# Patient Record
Sex: Male | Born: 1980
Health system: Southern US, Community
[De-identification: ages and names within clinical notes are randomized; demographics above are authoritative.]

## PROBLEM LIST (undated history)

## (undated) DIAGNOSIS — I10 Essential (primary) hypertension: Secondary | ICD-10-CM

## (undated) HISTORY — PX: MOUTH SURGERY: SHX715

## (undated) HISTORY — PX: OTHER SURGICAL HISTORY: SHX169

## (undated) HISTORY — DX: Essential (primary) hypertension: I10

---

## 2003-06-13 ENCOUNTER — Emergency Department (HOSPITAL_COMMUNITY): Admission: EM | Admit: 2003-06-13 | Discharge: 2003-06-14 | Payer: Self-pay | Admitting: Emergency Medicine

## 2006-04-20 ENCOUNTER — Emergency Department (HOSPITAL_COMMUNITY): Admission: EM | Admit: 2006-04-20 | Discharge: 2006-04-21 | Payer: Self-pay | Admitting: Emergency Medicine

## 2012-03-10 ENCOUNTER — Emergency Department (HOSPITAL_COMMUNITY)
Admission: EM | Admit: 2012-03-10 | Discharge: 2012-03-10 | Disposition: A | Discharge status: Home / Self Care | Payer: PRIVATE HEALTH INSURANCE | Attending: Emergency Medicine | Admitting: Emergency Medicine

## 2012-03-10 ENCOUNTER — Encounter (HOSPITAL_COMMUNITY): Payer: Self-pay | Admitting: *Deleted

## 2012-03-10 DIAGNOSIS — R1013 Epigastric pain: Secondary | ICD-10-CM | POA: Insufficient documentation

## 2012-03-10 DIAGNOSIS — R112 Nausea with vomiting, unspecified: Secondary | ICD-10-CM

## 2012-03-10 DIAGNOSIS — R197 Diarrhea, unspecified: Secondary | ICD-10-CM | POA: Insufficient documentation

## 2012-03-10 LAB — POCT I-STAT, CHEM 8
HCT: 52 % (ref 39.0–52.0)
Hemoglobin: 17.7 g/dL — ABNORMAL HIGH (ref 13.0–17.0)
Potassium: 3.9 mEq/L (ref 3.5–5.1)
Sodium: 141 mEq/L (ref 135–145)
TCO2: 27 mmol/L (ref 0–100)

## 2012-03-10 MED ORDER — ONDANSETRON HCL 4 MG/2ML IJ SOLN
4.0000 mg | Freq: Once | INTRAMUSCULAR | Status: AC
  Administered 2012-03-10: 4 mg via INTRAVENOUS
  Filled 2012-03-10: qty 2

## 2012-03-10 MED ORDER — ONDANSETRON 4 MG PO TBDP: 4.0000 mg | ORAL_TABLET | Freq: Three times a day (TID) | ORAL | Status: DC | PRN

## 2012-03-10 MED ORDER — SODIUM CHLORIDE 0.9 % IV BOLUS (SEPSIS)
1000.0000 mL | Freq: Once | INTRAVENOUS | Status: AC
  Administered 2012-03-10: 1000 mL via INTRAVENOUS

## 2012-03-10 NOTE — ED Provider Notes (Signed)
History     CSN: [id number]  Arrival date & time 03/10/12  0724   First MD Initiated Contact with Patient 03/10/12 [phone]      Chief Complaint  Patient presents with  . Nausea  . Emesis  . Diarrhea  . Abdominal Pain    (Consider location/radiation/quality/duration/timing/severity/associated sxs/prior treatment) HPI Comments: Patient presents today with a chief complaint of nausea, vomiting, diarrhea.  He has also had some epigastric tenderness with episodes of vomiting.  He denies any blood in his emesis or blood in his stool.  He denies fever or chills.  He denies urinary symptoms.  He did not take anything for nausea, vomiting, or diarrhea.  He denies any prior history of abdominal surgeries.    Patient is a 32 y.o. male presenting with vomiting. The history is provided by the patient.  Emesis  This is a new problem. The current episode started yesterday. The problem occurs more than 10 times per day. The problem has not changed since onset.The emesis has an appearance of stomach contents. There has been no fever. Associated symptoms include abdominal pain and diarrhea. Pertinent negatives include no chills and no fever. Risk factors: No known ill contacts.    History reviewed. No pertinent past medical history.  Past Surgical History  Procedure Date  . None     History reviewed. No pertinent family history.  History  Substance Use Topics  . Smoking status: Never Smoker   . Smokeless tobacco: Never Used  . Alcohol Use: Yes     Comment: socially      Review of Systems  Constitutional: Negative for fever and chills.  Respiratory: Negative for shortness of breath.   Cardiovascular: Negative for chest pain.  Gastrointestinal: Positive for nausea, vomiting, abdominal pain and diarrhea. Negative for constipation, blood in stool and abdominal distention.  Genitourinary: Negative for dysuria, urgency, frequency, scrotal swelling and testicular pain.  All other systems  reviewed and are negative.    Allergies  Food  Home Medications   Current Outpatient Rx  Name  Route  Sig  Dispense  Refill  . ACETAMINOPHEN 500 MG PO TABS   Oral   Take 500 mg by mouth every 6 (six) hours as needed. For pain.         Marland Kitchen BENZONATATE 100 MG PO CAPS   Oral   Take 100 mg by mouth 3 (three) times daily as needed. For cough.         Marland Kitchen PSEUDOEPHEDRINE HCL 30 MG PO TABS   Oral   Take 30 mg by mouth every 4 (four) hours as needed. For nasal/sinus congestion.           BP 129/86  Pulse 125  Temp 98 F (36.7 C) (Oral)  Resp 18  Wt 253 lb (114.76 kg)  SpO2 99%  Physical Exam  Nursing note and vitals reviewed. Constitutional: He appears well-developed and well-nourished. No distress.  HENT:  Head: Normocephalic and atraumatic.  Mouth/Throat: Uvula is midline and oropharynx is clear and moist. Mucous membranes are dry.  Neck: Normal range of motion. Neck supple.  Cardiovascular: Normal rate, regular rhythm and normal heart sounds.   Pulmonary/Chest: Effort normal and breath sounds normal.  Abdominal: Soft. Normal appearance and bowel sounds are normal. He exhibits no distension and no mass. There is tenderness. There is no rebound and no guarding.       Mild epigastric tenderness  Musculoskeletal: Normal range of motion.  Neurological: He is alert.  Skin: Skin  is warm and dry. He is not diaphoretic.  Psychiatric: He has a normal mood and affect.    ED Course  Procedures (including critical care time)  Labs Reviewed  POCT I-STAT, CHEM 8 - Abnormal; Notable for the following:    Glucose, Bld 125 (*)     Calcium, Ion 1.11 (*)     Hemoglobin 17.7 (*)     All other components within normal limits   No results found.   No diagnosis found.  9:57 AM Patient reports that his abdominal pain and nausea has improved at this time.  He is able to tolerate PO liquids.  MDM  Patient presenting with nausea, vomiting, and diarrhea.  I stat labs WNL.   Patient given IVF and IV Zofran and symptoms improved. Patient afebrile.  Mild epigastric pain on exam, but no pain over McBurney's point.  Negative Murphy's sign.  No rebound or guarding.  Patient discharged home with Rx for Zofran.  Return precautions discussed.          Pascal Lux [place], PA-C 03/10/12 [phone]

## 2012-03-10 NOTE — ED Notes (Signed)
Pt states the "GI virus has been going around his house (wife and daughter) recently got over it". States "he feels like he was getting dehydrated due to the inability to hold any foods or liquids down and feeling extremely weak". Mucous membranes pink and dry. Denies any fevers, some abd soreness from throwing up. States he started with the n/v yesterday around 6pm and then the diarhea during the night.

## 2012-03-10 NOTE — ED Notes (Signed)
Pt from home with reports of N/V/D and generalized abdominal pain that started yesterday at 1700. Pt reports being treated at Urgent Care for flu-like symptoms on Sunday.

## 2012-03-10 NOTE — ED Provider Notes (Signed)
Medical screening examination/treatment/procedure(s) were performed by non-physician practitioner and as supervising physician I was immediately available for consultation/collaboration.   Gwyneth Sprout, MD 03/10/12 2029

## 2015-03-20 ENCOUNTER — Ambulatory Visit: Payer: PRIVATE HEALTH INSURANCE | Admitting: Family Medicine

## 2015-03-20 ENCOUNTER — Ambulatory Visit (INDEPENDENT_AMBULATORY_CARE_PROVIDER_SITE_OTHER): Payer: 59 | Admitting: Family Medicine

## 2015-03-20 ENCOUNTER — Encounter: Payer: Self-pay | Admitting: Family Medicine

## 2015-03-20 VITALS — BP 180/116 | HR 88 | Ht 73.0 in | Wt 245.0 lb

## 2015-03-20 DIAGNOSIS — M25561 Pain in right knee: Secondary | ICD-10-CM

## 2015-03-20 NOTE — Patient Instructions (Signed)
You have a lateral plica (a band of soft tissue that gets pinched under the kneecap) - this is related to patellofemoral syndrome. Avoid painful activities when possible (often deep squats, lunges, leg press bother this) Cross train with swimming, cycling with low resistance, elliptical if needed. Straight leg raise, hip side raises, straight leg raises with foot turned outwards 3 sets of 10 once a day. Add ankle weight if thesee become too easy. Consider formal physical therapy Correct foot breakdown with something like dr. Jari Sportsman active series, superfeet. Avoid flat shoes, barefoot walking as much as possible the next 6 weeks. Icing 15 minutes at a time 3-4 times a day as needed. Tylenol or aleve as needed for pain We can consider a cortisone shot into the plica directly if not improving though the data on this is mixed. Follow up with me in 6 weeks.

## 2015-03-21 DIAGNOSIS — M25561 Pain in right knee: Secondary | ICD-10-CM | POA: Insufficient documentation

## 2015-03-21 NOTE — Assessment & Plan Note (Signed)
2/2 lateral plica related to patellofemoral syndrome.  Shown home exercises to do daily.  Orthotics (dr. Jari Sportsman active series, superfeet or something similar).  Consider physical therapy, injection if not improving.  F/u in 6 weeks.

## 2015-03-21 NOTE — Progress Notes (Signed)
PCP: No primary care provider on file.  Subjective:   HPI: Patient is a 35 y.o. male here for right knee pain.  Patient reports he's had right knee pain for about a month. States he gets a rubber band sensation anterolateral right knee. Associated popping and feels like knee will give out. Pain level 5/10, can be sharp. Works two jobs - worse as he's more active. Better standing than sitting. No skin changes, fever, other complaints.  No past medical history on file.  Current Outpatient Prescriptions on File Prior to Visit  Medication Sig Dispense Refill  . acetaminophen (TYLENOL) 500 MG tablet Take 500 mg by mouth every 6 (six) hours as needed. For pain.    . benzonatate (TESSALON) 100 MG capsule Take 100 mg by mouth 3 (three) times daily as needed. For cough.    . ondansetron (ZOFRAN ODT) 4 MG disintegrating tablet Take 1 tablet (4 mg total) by mouth every 8 (eight) hours as needed for nausea. 20 tablet 0  . pseudoephedrine (SUDAFED) 30 MG tablet Take 30 mg by mouth every 4 (four) hours as needed. For nasal/sinus congestion.     No current facility-administered medications on file prior to visit.    Past Surgical History  Procedure Laterality Date  . None      Allergies  Allergen Reactions  . Food Swelling    WALNUTS    Social History   Social History  . Marital Status: Single    Spouse Name: N/A  . Number of Children: N/A  . Years of Education: N/A   Occupational History  . Not on file.   Social History Main Topics  . Smoking status: Never Smoker   . Smokeless tobacco: Never Used  . Alcohol Use: 0.0 oz/week    0 Standard drinks or equivalent per week     Comment: socially  . Drug Use: No  . Sexual Activity: Not on file   Other Topics Concern  . Not on file   Social History Narrative    No family history on file.  BP 180/116 mmHg  Pulse 88  Ht  (1.854 m)  Wt 245 lb (111.131 kg)  BMI 32.33 kg/m2  Review of Systems: See HPI above.     Objective:  Physical Exam:  Gen: NAD  Right knee: No gross deformity, ecchymoses, effusion.  Palpable plica laterally felt as goes from flexion to extension. Mild TTP here.  No joint line, post patellar or other tenderness. FROM. Negative ant/post drawers. Negative valgus/varus testing. Negative lachmanns. Negative mcmurrays, apleys, patellar apprehension. NV intact distally. Overpronation. Hip abduction 5-/5.  Minimal VMO atrophy.  Left knee: FROM without pain.    Assessment & Plan:  1. Right knee pain - 2/2 lateral plica related to patellofemoral syndrome.  Shown home exercises to do daily.  Orthotics (dr. Jari Sportsman active series, superfeet or something similar).  Consider physical therapy, injection if not improving.  F/u in 6 weeks.

## 2015-09-19 MED FILL — FLUTICASONE PROP 50 MCG SPR: 50 | 30 days supply | Qty: 16 | Fill #0

## 2015-09-19 MED FILL — AMOXICILLIN 875 MG TABLET: 875 | 10 days supply | Qty: 20 | Fill #0

## 2015-10-31 MED FILL — predniSONE 20 MG TABS: 20 | 5 days supply | Qty: 10 | Fill #0

## 2016-07-21 DIAGNOSIS — I10 Essential (primary) hypertension: Secondary | ICD-10-CM | POA: Diagnosis not present

## 2016-07-21 DIAGNOSIS — J0100 Acute maxillary sinusitis, unspecified: Secondary | ICD-10-CM | POA: Diagnosis not present

## 2016-09-01 DIAGNOSIS — I10 Essential (primary) hypertension: Secondary | ICD-10-CM | POA: Diagnosis not present

## 2016-09-19 ENCOUNTER — Ambulatory Visit (INDEPENDENT_AMBULATORY_CARE_PROVIDER_SITE_OTHER): Payer: 59 | Admitting: Family Medicine

## 2016-09-19 ENCOUNTER — Encounter: Payer: Self-pay | Admitting: Family Medicine

## 2016-09-19 VITALS — BP 130/100 | HR 83 | Ht 72.0 in | Wt 239.8 lb

## 2016-09-19 DIAGNOSIS — Z114 Encounter for screening for human immunodeficiency virus [HIV]: Secondary | ICD-10-CM | POA: Diagnosis not present

## 2016-09-19 DIAGNOSIS — Z0001 Encounter for general adult medical examination with abnormal findings: Secondary | ICD-10-CM

## 2016-09-19 DIAGNOSIS — Z0000 Encounter for general adult medical examination without abnormal findings: Secondary | ICD-10-CM

## 2016-09-19 DIAGNOSIS — I10 Essential (primary) hypertension: Secondary | ICD-10-CM

## 2016-09-19 LAB — CBC WITH DIFFERENTIAL/PLATELET
BASOS ABS: 0 10*3/uL (ref 0.0–0.1)
Basophils Relative: 0.6 % (ref 0.0–3.0)
EOS ABS: 0.2 10*3/uL (ref 0.0–0.7)
Eosinophils Relative: 2 % (ref 0.0–5.0)
HEMATOCRIT: 45 % (ref 39.0–52.0)
Hemoglobin: 14.9 g/dL (ref 13.0–17.0)
LYMPHS PCT: 38.2 % (ref 12.0–46.0)
Lymphs Abs: 3 10*3/uL (ref 0.7–4.0)
MCHC: 33.2 g/dL (ref 30.0–36.0)
MCV: 87.8 fl (ref 78.0–100.0)
MONO ABS: 0.6 10*3/uL (ref 0.1–1.0)
Monocytes Relative: 7.4 % (ref 3.0–12.0)
NEUTROS ABS: 4 10*3/uL (ref 1.4–7.7)
Neutrophils Relative %: 51.8 % (ref 43.0–77.0)
PLATELETS: 270 10*3/uL (ref 150.0–400.0)
RBC: 5.13 Mil/uL (ref 4.22–5.81)
RDW: 13.8 % (ref 11.5–15.5)
WBC: 7.8 10*3/uL (ref 4.0–10.5)

## 2016-09-19 LAB — COMPREHENSIVE METABOLIC PANEL
ALBUMIN: 4.2 g/dL (ref 3.5–5.2)
ALT: 15 U/L (ref 0–53)
AST: 20 U/L (ref 0–37)
Alkaline Phosphatase: 59 U/L (ref 39–117)
BILIRUBIN TOTAL: 0.4 mg/dL (ref 0.2–1.2)
BUN: 17 mg/dL (ref 6–23)
CALCIUM: 9.7 mg/dL (ref 8.4–10.5)
CO2: 31 meq/L (ref 19–32)
CREATININE: 0.99 mg/dL (ref 0.40–1.50)
Chloride: 102 mEq/L (ref 96–112)
GFR: 111.35 mL/min (ref >60.00)
Glucose, Bld: 95 mg/dL (ref 70–99)
Potassium: 4.1 mEq/L (ref 3.5–5.1)
Sodium: 137 mEq/L (ref 135–145)
Total Protein: 6.9 g/dL (ref 6.0–8.3)

## 2016-09-19 LAB — LIPID PANEL
CHOLESTEROL: 138 mg/dL (ref 0–200)
HDL: 48.7 mg/dL (ref >39.00)
LDL CALC: 73 mg/dL (ref 0–99)
NonHDL: 88.83
TRIGLYCERIDES: 79 mg/dL (ref 0.0–149.0)
Total CHOL/HDL Ratio: 3
VLDL: 15.8 mg/dL (ref 0.0–40.0)

## 2016-09-19 LAB — HEMOGLOBIN A1C: HEMOGLOBIN A1C: 5.6 % (ref 4.6–6.5)

## 2016-09-19 NOTE — Patient Instructions (Addendum)
We will check blood work today.  You goal blood pressure is 140/90. If your home readings are consistently above this, please let us know.  Otherwise, we will plan to see you again in 6 months to a year.  Take care,  Dr Jerline Pain    Preventive Care 18-39 Years, Male Preventive care refers to lifestyle choices and visits with your health care provider that can promote health and wellness. What does preventive care include?  A yearly physical exam. This is also called an annual well check.  Dental exams once or twice a year.  Routine eye exams. Ask your health care provider how often you should have your eyes checked.  Personal lifestyle choices, including: ? Daily care of your teeth and gums. ? Regular physical activity. ? Eating a healthy diet. ? Avoiding tobacco and drug use. ? Limiting alcohol use. ? Practicing safe sex. What happens during an annual well check? The services and screenings done by your health care provider during your annual well check will depend on your age, overall health, lifestyle risk factors, and family history of disease. Counseling Your health care provider may ask you questions about your:  Alcohol use.  Tobacco use.  Drug use.  Emotional well-being.  Home and relationship well-being.  Sexual activity.  Eating habits.  Work and work Statistician.  Screening You may have the following tests or measurements:  Height, weight, and BMI.  Blood pressure.  Lipid and cholesterol levels. These may be checked every 5 years starting at age 70.  Diabetes screening. This is done by checking your blood sugar (glucose) after you have not eaten for a while (fasting).  Skin check.  Hepatitis C blood test.  Hepatitis B blood test.  Sexually transmitted disease (STD) testing.  Discuss your test results, treatment options, and if necessary, the need for more tests with your health care provider. Vaccines Your health care provider may  recommend certain vaccines, such as:  Influenza vaccine. This is recommended every year.  Tetanus, diphtheria, and acellular pertussis (Tdap, Td) vaccine. You may need a Td booster every 10 years.  Varicella vaccine. You may need this if you have not been vaccinated.  HPV vaccine. If you are 82 or younger, you may need three doses over 6 months.  Measles, mumps, and rubella (MMR) vaccine. You may need at least one dose of MMR.You may also need a second dose.  Pneumococcal 13-valent conjugate (PCV13) vaccine. You may need this if you have certain conditions and have not been vaccinated.  Pneumococcal polysaccharide (PPSV23) vaccine. You may need one or two doses if you smoke cigarettes or if you have certain conditions.  Meningococcal vaccine. One dose is recommended if you are age 20-21 years and a first-year college student living in a residence hall, or if you have one of several medical conditions. You may also need additional booster doses.  Hepatitis A vaccine. You may need this if you have certain conditions or if you travel or work in places where you may be exposed to hepatitis A.  Hepatitis B vaccine. You may need this if you have certain conditions or if you travel or work in places where you may be exposed to hepatitis B.  Haemophilus influenzae type b (Hib) vaccine. You may need this if you have certain risk factors.  Talk to your health care provider about which screenings and vaccines you need and how often you need them. This information is not intended to replace advice given to you by  your health care provider. Make sure you discuss any questions you have with your health care provider. Document Released: 03/19/2001 Document Revised: 10/11/2015 Document Reviewed: 11/22/2014 Elsevier Interactive Patient Education  2017 [employer].

## 2016-09-19 NOTE — Assessment & Plan Note (Signed)
Slightly above goal today. Continue current regimen of lisinopril-HCTZ. Advised patient to continue home blood pressure monitoring and let us know if persistently elevated above 140/90. Check CMET today, A1c, and lipid panel. Follow up 6 months.

## 2016-09-19 NOTE — Progress Notes (Addendum)
Subjective:  Timothy Sellers is a 36 y.o. male who presents today with a chief complaint of hypertension and also to establish care.   HPI:  Hypertension, Chronic Problem, New to this provider.  BP Readings from Last 3 Encounters:  09/19/16 (!) 130/100  03/20/15 (!) 180/116  03/10/12 147/70   Home BP monitoring-Yes Compliant with medications-yes, without side effects. Started on lisinopril-HCTZ at urgent care 2 weeks ago.   Denies nocturnal apneic events or daytime somnolence.   ROS: Denies any chest pain, shortness of breath, dyspnea on exertion, leg edema.   Lifestyle Diet: Balanced. Plenty of fruits and vegetables.  Exercise: Gets a lot of exercise at work.   Depression screen PHQ 2/9 09/19/2016  Decreased Interest 0  Down, Depressed, Hopeless 0  PHQ - 2 Score 0    Health Maintenance Due  Topic Date Due  . HIV Screening  10/26/1997  . TETANUS/TDAP  10/26/2001     ROS: Per HPI, otherwise all systems reviewed and are negative  PMH:  The following were reviewed and entered/updated in epic: Past Medical History:  Diagnosis Date  . Hypertension    Patient Active Problem List   Diagnosis Date Noted  . Hypertension 09/19/2016  . Right knee pain 03/21/2015   Past Surgical History:  Procedure Laterality Date  . MOUTH SURGERY    . None      Family History  Problem Relation Age of Onset  . Hypertension Mother   . Kidney failure Father   . Diabetes Father   . Asthma Sister   . Kidney failure Maternal Grandmother   . Stroke Maternal Grandfather     Medications- reviewed and updated Current Outpatient Prescriptions  Medication Sig Dispense Refill  . lisinopril-hydrochlorothiazide (PRINZIDE,ZESTORETIC) 10-12.5 MG tablet Take 1 tablet by mouth daily.     No current facility-administered medications for this visit.    Allergies- reviewed and updated Allergies  Allergen Reactions  . Food Swelling    WALNUTS     Social History   Social History  .  Marital status: Married    Spouse name: N/A  . Number of children: 2  . Years of education: N/A   Occupational History  .      Owner of a cleaning company   Social History Main Topics  . Smoking status: Never Smoker  . Smokeless tobacco: Never Used  . Alcohol use 0.0 oz/week     Comment: socially  . Drug use: No  . Sexual activity: Yes   Other Topics Concern  . None   Social History Narrative  . None   Objective:  Physical Exam: BP (!) 130/100 (BP Location: Left Arm, Cuff Size: Large)   Pulse 83   Ht 6' (1.829 m)   Wt 239 lb 12.8 oz (108.8 kg)   SpO2 98%   BMI 32.52 kg/m   Body mass index is 32.52 kg/m. Gen: NAD, resting comfortably CV: RRR with no murmurs appreciated Pulm: NWOB, CTAB with no crackles, wheezes, or rhonchi GI: Normal bowel sounds present. Soft, Nontender, Nondistended. MSK: no edema, cyanosis, or clubbing noted Skin: warm, dry Neuro: grossly normal, moves all extremities Psych: Normal affect and thought content  Assessment/Plan:  Hypertension Slightly above goal today. Continue current regimen of lisinopril-HCTZ. Advised patient to continue home blood pressure monitoring and let us know if persistently elevated above 140/90. Check CMET today, A1c, and lipid panel. Follow up 6 months.   Preventative Healthcare Check lipids and A1c today. Check HIV.  Patient Counseling:  -  Nutrition: Stressed importance of moderation in sodium/caffeine intake, saturated fat and cholesterol, caloric balance, sufficient intake of fresh fruits, vegetables, and fiber.  -Stressed the importance of regular exercise.   -Substance Abuse: Discussed cessation/primary prevention of tobacco, alcohol, or other drug use; driving or other dangerous activities under the influence; availability of treatment for abuse.   -Injury prevention: Discussed safety belts, safety helmets, smoke detector, smoking near bedding or upholstery.   -Dental health: Discussed importance of regular  tooth brushing, flossing, and dental visits.  -Health maintenance and immunizations reviewed. Please refer to Health maintenance section.  Return to care in 1 year for next preventative visit.   Katina Degreealeb M. Jimmey RalphParker, MD 09/19/2016 10:17 AM

## 2016-09-20 LAB — HIV ANTIBODY (ROUTINE TESTING W REFLEX): HIV: NONREACTIVE

## 2016-09-25 ENCOUNTER — Telehealth: Payer: Self-pay | Admitting: Family Medicine

## 2016-09-25 NOTE — Telephone Encounter (Signed)
ROI faxed to [facility] °

## 2016-10-04 NOTE — Addendum Note (Signed)
Addended by: Ardith DarkPARKER, CALEB M on: 10/04/2016 02:03 PM   Modules accepted: Level of Service

## 2016-11-06 ENCOUNTER — Telehealth: Payer: Self-pay | Admitting: Family Medicine

## 2016-11-06 NOTE — Telephone Encounter (Signed)
MEDICATION:   lisinopril-hydrochlorothiazide (PRINZIDE,ZESTORETIC) 10-12.5 MG tablet     PHARMACY:   [id number] - [place], [place] - [address] [phone] (Phone) [phone] (Fax)     IS THIS A 90 DAY SUPPLY : yes  IS PATIENT OUT OF MEDICATION: yes  IF NOT; HOW MUCH IS LEFT:   LAST APPOINTMENT DATE: /16/2018  NEXT APPOINTMENT DATE:@Visit  date not found  OTHER COMMENTS:    **Let patient know to contact pharmacy at the end of the day to make sure medication is ready. **  ** Please notify patient to allow 48-72 hours to process**  **Encourage patient to contact the pharmacy for refills or they can request refills through [facility]**

## 2016-11-07 MED ORDER — LISINOPRIL-HYDROCHLOROTHIAZIDE 10-12.5 MG PO TABS: 1.0000 | ORAL_TABLET | Freq: Every day | ORAL | 3 refills | Status: DC

## 2016-11-07 NOTE — Telephone Encounter (Signed)
Prescription filled.  Timothy Sellers. Jimmey Ralph, MD 11/07/2016 9:22 AM

## 2016-12-30 NOTE — Telephone Encounter (Signed)
No records from [facility]

## 2017-02-04 ENCOUNTER — Emergency Department (HOSPITAL_COMMUNITY): Payer: 59

## 2017-02-04 ENCOUNTER — Other Ambulatory Visit: Payer: Self-pay

## 2017-02-04 ENCOUNTER — Encounter (HOSPITAL_COMMUNITY): Payer: Self-pay

## 2017-02-04 ENCOUNTER — Emergency Department (HOSPITAL_COMMUNITY)
Admission: EM | Admit: 2017-02-04 | Discharge: 2017-02-04 | Disposition: A | Discharge status: Home / Self Care | Payer: 59 | Attending: Physician Assistant | Admitting: Physician Assistant

## 2017-02-04 DIAGNOSIS — S93601A Unspecified sprain of right foot, initial encounter: Secondary | ICD-10-CM

## 2017-02-04 DIAGNOSIS — M79671 Pain in right foot: Secondary | ICD-10-CM | POA: Insufficient documentation

## 2017-02-04 DIAGNOSIS — S99921A Unspecified injury of right foot, initial encounter: Secondary | ICD-10-CM | POA: Diagnosis not present

## 2017-02-04 DIAGNOSIS — I10 Essential (primary) hypertension: Secondary | ICD-10-CM | POA: Insufficient documentation

## 2017-02-04 DIAGNOSIS — Z79899 Other long term (current) drug therapy: Secondary | ICD-10-CM | POA: Diagnosis not present

## 2017-02-04 MED ORDER — IBUPROFEN 800 MG PO TABS: 800.0000 mg | ORAL_TABLET | Freq: Three times a day (TID) | ORAL | 0 refills | Status: DC | PRN

## 2017-02-04 NOTE — ED Provider Notes (Signed)
[facility]-EMERGENCY DEPT Provider Note   CSN: [id number] Arrival date & time: 02/04/17  2107     History   Chief Complaint Chief Complaint  Patient presents with  . Foot Pain    R    HPI Timothy Sellers is a 37 y.o. male.  HPI Patient presents to the emergency department with foot pain that started while hiking today.  The patient states that he stepped on some uneven ground and felt a pop in his foot.  The patient states that he felt like his foot twisted.  The patient states that he was able to ambulate afterwards but there was pain with ambulation.  Patient states movement and certain palpation makes the pain worse.  Patient does not have any numbness or weakness in the foot. Past Medical History:  Diagnosis Date  . Hypertension     Patient Active Problem List   Diagnosis Date Noted  . Hypertension 09/19/2016  . Right knee pain 03/21/2015    Past Surgical History:  Procedure Laterality Date  . MOUTH SURGERY    . None         Home Medications    Prior to Admission medications   Medication Sig Start Date End Date Taking? Authorizing Provider  lisinopril-hydrochlorothiazide (PRINZIDE,ZESTORETIC) 10-12.5 MG tablet Take 1 tablet by mouth daily. 11/07/16   Ardith Dark, MD    Family History Family History  Problem Relation Age of Onset  . Hypertension Mother   . Kidney failure Father   . Diabetes Father   . Asthma Sister   . Kidney failure Maternal Grandmother   . Stroke Maternal Grandfather     Social History Social History   Tobacco Use  . Smoking status: Never Smoker  . Smokeless tobacco: Never Used  Substance Use Topics  . Alcohol use: Yes    Alcohol/week: 0.0 oz    Comment: socially  . Drug use: No     Allergies   Food   Review of Systems Review of Systems All other systems negative except as documented in the HPI. All pertinent positives and negatives as reviewed in the HPI.  Physical Exam Updated Vital  Signs BP (!) 151/109 (BP Location: Right Arm)   Pulse (!) 105   Temp 98 F (36.7 C) (Oral)   Resp 18   Ht 6\' 1"  (1.854 m)   Wt 110.2 kg (243 lb)   SpO2 99%   BMI 32.06 kg/m   Physical Exam  Constitutional: He is oriented to person, place, and time. He appears well-developed and well-nourished. No distress.  HENT:  Head: Normocephalic and atraumatic.  Eyes: Pupils are equal, round, and reactive to light.  Pulmonary/Chest: Effort normal.  Musculoskeletal:       Right foot: There is tenderness. There is normal range of motion, no swelling, normal capillary refill, no crepitus, no deformity and no laceration.  Neurological: He is alert and oriented to person, place, and time.  Skin: Skin is warm and dry.  Psychiatric: He has a normal mood and affect.  Nursing note and vitals reviewed.    ED Treatments / Results  Labs (all labs ordered are listed, but only abnormal results are displayed) Labs Reviewed - No data to display  EKG  EKG Interpretation None       Radiology Dg Foot Complete Right  Result Date: 02/04/2017 CLINICAL DATA:  Pt was hiking today and felt a pop in his right foot across the top of all metacarpals. Nothing was dropped on  the pt's right foot. Pt can hardly bare weight on right foot. EXAM: RIGHT FOOT COMPLETE - 3+ VIEW COMPARISON:  None. FINDINGS: There is no evidence of fracture or dislocation. There is no evidence of arthropathy or other focal bone abnormality. Soft tissues are unremarkable. IMPRESSION: Negative. Electronically Signed   By: Burman NievesWilliam  Stevens M.D.   On: 02/04/2017 22:11    Procedures Procedures (including critical care time)  Medications Ordered in ED Medications - No data to display   Initial Impression / Assessment and Plan / ED Course  I have reviewed the triage vital signs and the nursing notes.  Pertinent labs & imaging results that were available during my care of the patient were reviewed by me and considered in my medical  decision making (see chart for details).      The patient will be treated for sprain of his right foot.  Told to ice and elevate the foot will give him orthopedic follow-up.  Told to return here as needed.  Final Clinical Impressions(s) / ED Diagnoses   Final diagnoses:  None    ED Discharge Orders    None       Charlestine NightLawyer, Lavene Penagos, PA-C 02/04/17 2318    Abelino DerrickMackuen, Courteney Lyn, MD 02/05/17 0002

## 2017-02-04 NOTE — ED Triage Notes (Signed)
Pt reports that while he was hiking today, he stepped on an uneven surface and heard a pop in his foot. A&Ox4. He is ambulatory, but with pain. Pain is on the dorsal side of R foot. Ice applied.

## 2017-02-04 NOTE — ED Notes (Signed)
Bed: WTR5 Expected date:  Expected time:  Means of arrival:  Comments: 

## 2017-02-04 NOTE — Discharge Instructions (Signed)
Return here as needed. ice and elevate your foot.  Follow-up with the orthopedist provided or one of your choice.

## 2017-10-23 ENCOUNTER — Ambulatory Visit (INDEPENDENT_AMBULATORY_CARE_PROVIDER_SITE_OTHER): Payer: Self-pay | Admitting: *Deleted

## 2017-10-23 DIAGNOSIS — M25561 Pain in right knee: Secondary | ICD-10-CM

## 2017-10-23 DIAGNOSIS — G8929 Other chronic pain: Secondary | ICD-10-CM

## 2017-11-21 ENCOUNTER — Other Ambulatory Visit: Payer: Self-pay | Admitting: Family Medicine

## 2018-01-09 ENCOUNTER — Other Ambulatory Visit: Payer: Self-pay | Admitting: Family Medicine

## 2018-01-09 MED ORDER — LISINOPRIL-HYDROCHLOROTHIAZIDE 10-12.5 MG PO TABS: 1.0000 | ORAL_TABLET | Freq: Every day | ORAL | 0 refills | Status: DC

## 2018-01-09 NOTE — Telephone Encounter (Signed)
Refilled until appointment on 01/12/18 Requested Prescriptions  Pending Prescriptions Disp Refills  . lisinopril-hydrochlorothiazide (PRINZIDE,ZESTORETIC) 10-12.5 MG tablet 5 tablet 0    Sig: Take 1 tablet by mouth daily.     Cardiovascular:  ACEI + Diuretic Combos Failed - 01/09/2018 11:49 AM      Failed - Na in normal range and within 180 days    Sodium  Date Value Ref Range Status  09/19/2016 137 135 - 145 mEq/L Final         Failed - K in normal range and within 180 days    Potassium  Date Value Ref Range Status  09/19/2016 4.1 3.5 - 5.1 mEq/L Final         Failed - Cr in normal range and within 180 days    Creatinine, Ser  Date Value Ref Range Status  09/19/2016 0.99 0.40 - 1.50 mg/dL Final         Failed - Ca in normal range and within 180 days    Calcium  Date Value Ref Range Status  09/19/2016 9.7 8.4 - 10.5 mg/dL Final         Failed - Last BP in normal range    BP Readings from Last 1 Encounters:  02/04/17 (!) 151/109         Failed - Valid encounter within last 6 months    Recent Outpatient Visits          1 year ago Well adult exam   [facility] [place] Parker, Katina Degreealeb M, MD      Future Appointments            In 3 days Ardith DarkParker, Caleb M, MD [facility]           Passed - Patient is not pregnant

## 2018-01-09 NOTE — Addendum Note (Signed)
Addended by: Hayes LudwigYATES, Luvinia Lucy B on: 01/09/2018 11:49 AM   Modules accepted: Orders

## 2018-01-09 NOTE — Telephone Encounter (Signed)
Copied from CRM [id number]. Topic: Quick Communication - Rx Refill/Question >> Jan 09, 2018 11:33 AM Jaquita Rectoravis, Karen A wrote: Medication: lisinopril-hydrochlorothiazide (PRINZIDE,ZESTORETIC) 10-12.5 MG tablet  Patient is out of medication, has an appointment on 01/12/18  Has the patient contacted their pharmacy? Yes.   (Agent: If no, request that the patient contact the pharmacy for the refill.) (Agent: If yes, when and what did the pharmacy advise?)  Preferred Pharmacy (with phone number or street name): [facility] [id number] - [place], [place] - [address] [phone] (Phone) [phone] (Fax)    Agent: Please be advised that RX refills may take up to 3 business days. We ask that you follow-up with your pharmacy.

## 2018-01-12 ENCOUNTER — Encounter: Payer: Self-pay | Admitting: Family Medicine

## 2018-01-12 ENCOUNTER — Ambulatory Visit (INDEPENDENT_AMBULATORY_CARE_PROVIDER_SITE_OTHER): Payer: 59 | Admitting: Family Medicine

## 2018-01-12 VITALS — BP 158/106 | HR 81 | Temp 98.5°F | Ht 73.0 in | Wt 260.0 lb

## 2018-01-12 DIAGNOSIS — I10 Essential (primary) hypertension: Secondary | ICD-10-CM | POA: Diagnosis not present

## 2018-01-12 DIAGNOSIS — E669 Obesity, unspecified: Secondary | ICD-10-CM

## 2018-01-12 DIAGNOSIS — Z6834 Body mass index (BMI) 34.0-34.9, adult: Secondary | ICD-10-CM

## 2018-01-12 MED ORDER — LOSARTAN POTASSIUM 100 MG PO TABS
100.0000 mg | ORAL_TABLET | Freq: Every day | ORAL | 3 refills | Status: DC
Start: 2018-01-12 — End: 2018-03-06

## 2018-01-12 NOTE — Progress Notes (Signed)
   Subjective:  Timothy Sellers is a [age] y.o. male who presents today with a chief complaint of HTN.   HPI:  HTN, chronic problem, uncontrolled Chronic problem.  Several year history.  Last seen over a year ago.  Blood pressure was uncontrolled at that point.  He is currently on lisinopril/HCTZ but has been out of meds for the past 2 days.  No chest pain or shortness of breath.  She has been under a lot of stress recently due to his job, which has elevated his blood pressure.  No other obvious alleviating or aggravating factors.  No reported side effects aside from occasional dry cough.  Obesity No specific diet or exercise.  Has been under a lot of stress recently as noted above.  ROS: Per HPI  PMH: He reports that he has never smoked. He has never used smokeless tobacco. He reports that he drinks alcohol. He reports that he does not use drugs.  Objective:  Physical Exam: BP (!) 158/106 (BP Location: Left Arm, Patient Position: Sitting, Cuff Size: Large)   Pulse 81   Temp 98.5 F (36.9 C) (Oral)   Ht 6\' 1"  (1.854 m)   Wt 260 lb (117.9 kg)   SpO2 98%   BMI 34.30 kg/m   Wt Readings from Last 3 Encounters:  01/12/18 260 lb (117.9 kg)  02/04/17 243 lb (110.2 kg)  09/19/16 239 lb 12.8 oz (108.8 kg)  Gen: NAD, resting comfortably CV: RRR with no murmurs appreciated Pulm: NWOB, CTAB with no crackles, wheezes, or rhonchi MSK: No edema, cyanosis, or clubbing noted Skin: Warm, dry Neuro: Grossly normal, moves all extremities Psych: Normal affect and thought content  Assessment/Plan:  Hypertension Above goal.  Given his dry cough, will switch from his current lisinopril-HCTZ to losartan 100 mg daily.  Check CBC, CMET, and TSH.  Stressed importance of home blood pressure monitoring with goal 140/90 or lower.  Discussed importance of regular exercise and healthy, balanced, low-salt diet.  Obesity BMI 34.3 today.  Briefly discussed lifestyle modifications.  Katina Degreealeb M. Jimmey RalphParker,  MD 01/12/2018 5:00 PM

## 2018-01-12 NOTE — Assessment & Plan Note (Addendum)
Above goal.  Given his dry cough, will switch from his current lisinopril-HCTZ to losartan 100 mg daily.  Check CBC, CMET, and TSH.  Stressed importance of home blood pressure monitoring with goal 140/90 or lower.  Discussed importance of regular exercise and healthy, balanced, low-salt diet.

## 2018-01-12 NOTE — Patient Instructions (Signed)
It was very nice to see you today!  Please stop the lisinopril and is HCTZ.  Please start losartan 100 mg daily.  We will check blood work today.  Given on your blood pressure not me know if it is persistently 140/90 or higher.  Please come back soon for blood pressure recheck if your blood pressure is persistently elevated.  Take care, Dr Jimmey RalphParker

## 2018-01-12 NOTE — Assessment & Plan Note (Signed)
BMI 34.3 today.  Briefly discussed lifestyle modifications.

## 2018-01-13 LAB — COMPREHENSIVE METABOLIC PANEL
ALT: 13 U/L (ref 0–53)
AST: 19 U/L (ref 0–37)
Albumin: 4.3 g/dL (ref 3.5–5.2)
Alkaline Phosphatase: 54 U/L (ref 39–117)
BUN: 17 mg/dL (ref 6–23)
CO2: 30 meq/L (ref 19–32)
Calcium: 9.2 mg/dL (ref 8.4–10.5)
Chloride: 104 mEq/L (ref 96–112)
Creatinine, Ser: 1.03 mg/dL (ref 0.40–1.50)
GFR: 105.56 mL/min (ref >60.00)
GLUCOSE: 91 mg/dL (ref 70–99)
POTASSIUM: 4 meq/L (ref 3.5–5.1)
SODIUM: 140 meq/L (ref 135–145)
Total Bilirubin: 0.4 mg/dL (ref 0.2–1.2)
Total Protein: 6.7 g/dL (ref 6.0–8.3)

## 2018-01-13 LAB — CBC
HEMATOCRIT: 42.5 % (ref 39.0–52.0)
HEMOGLOBIN: 14.1 g/dL (ref 13.0–17.0)
MCHC: 33.1 g/dL (ref 30.0–36.0)
MCV: 86.9 fl (ref 78.0–100.0)
Platelets: 270 10*3/uL (ref 150.0–400.0)
RBC: 4.89 Mil/uL (ref 4.22–5.81)
RDW: 13.7 % (ref 11.5–15.5)
WBC: 9.1 10*3/uL (ref 4.0–10.5)

## 2018-01-13 LAB — TSH: TSH: 0.97 u[IU]/mL (ref 0.35–4.50)

## 2018-01-14 NOTE — Progress Notes (Signed)
Please inform patient of the following:  His blood work is all normal. Would like for him to continue taking losartan 100mg  daily as we discussed at his appointment. Would like for him to keep a close eye on his BP and let me know if persistently 140/90 or higher.  Will see him back for follow up in 3-6 months if his blood pressures are well controlled, or sooner if needed.  Katina Degreealeb M. Jimmey RalphParker, MD 01/14/2018 8:16 AM

## 2018-02-01 DIAGNOSIS — R072 Precordial pain: Secondary | ICD-10-CM | POA: Diagnosis not present

## 2018-02-01 DIAGNOSIS — K6389 Other specified diseases of intestine: Secondary | ICD-10-CM | POA: Diagnosis not present

## 2018-02-01 DIAGNOSIS — R9431 Abnormal electrocardiogram [ECG] [EKG]: Secondary | ICD-10-CM | POA: Diagnosis not present

## 2018-02-01 DIAGNOSIS — K529 Noninfective gastroenteritis and colitis, unspecified: Secondary | ICD-10-CM | POA: Diagnosis not present

## 2018-02-01 DIAGNOSIS — K449 Diaphragmatic hernia without obstruction or gangrene: Secondary | ICD-10-CM | POA: Diagnosis not present

## 2018-02-01 DIAGNOSIS — R079 Chest pain, unspecified: Secondary | ICD-10-CM | POA: Diagnosis not present

## 2018-02-01 DIAGNOSIS — R1031 Right lower quadrant pain: Secondary | ICD-10-CM | POA: Diagnosis not present

## 2018-02-02 DIAGNOSIS — R9431 Abnormal electrocardiogram [ECG] [EKG]: Secondary | ICD-10-CM | POA: Diagnosis not present

## 2018-03-05 ENCOUNTER — Other Ambulatory Visit: Payer: Self-pay | Admitting: Family Medicine

## 2018-04-14 ENCOUNTER — Encounter: Payer: Self-pay | Admitting: Family Medicine

## 2018-04-14 ENCOUNTER — Ambulatory Visit (INDEPENDENT_AMBULATORY_CARE_PROVIDER_SITE_OTHER): Payer: Self-pay | Admitting: Family Medicine

## 2018-04-14 VITALS — BP 154/90 | HR 67 | Temp 98.6°F | Ht 73.0 in | Wt 255.2 lb

## 2018-04-14 DIAGNOSIS — Z6833 Body mass index (BMI) 33.0-33.9, adult: Secondary | ICD-10-CM

## 2018-04-14 DIAGNOSIS — I10 Essential (primary) hypertension: Secondary | ICD-10-CM

## 2018-04-14 DIAGNOSIS — Z0001 Encounter for general adult medical examination with abnormal findings: Secondary | ICD-10-CM

## 2018-04-14 MED ORDER — AMLODIPINE BESYLATE 10 MG PO TABS: 10.0000 mg | ORAL_TABLET | Freq: Every day | ORAL | 3 refills | Status: DC

## 2018-04-14 NOTE — Assessment & Plan Note (Addendum)
Above goal today.  His pharmacy is out of losartan.  We will stop his lisinopril-HCTZ.  Start amlodipine 10 mg daily.  Follow-up in 2 weeks for blood pressure recheck.  Discussed home blood pressure monitoring goal 140/90 or lower.  Recently had blood work including CBC, CMET, and TSH-do not need to repeat today.

## 2018-04-14 NOTE — Progress Notes (Signed)
Chief Complaint:  Timothy Sellers is a 38 y.o. male who presents today for his annual comprehensive physical exam.    Assessment/Plan:  Hypertension Above goal today.  His pharmacy is out of losartan.  We will stop his lisinopril-HCTZ.  Start amlodipine 10 mg daily.  Follow-up in 2 weeks for blood pressure recheck.  Discussed home blood pressure monitoring goal 140/90 or lower.  Recently had blood work including CBC, CMET, and TSH-do not need to repeat today.  BMI 33 Discussed lifestyle conditions.  Preventative Healthcare: UTD on vaccines and screenings.   Patient Counseling(The following topics were reviewed and/or handout was given):  -Nutrition: Stressed importance of moderation in sodium/caffeine intake, saturated fat and cholesterol, caloric balance, sufficient intake of fresh fruits, vegetables, and fiber.  -Stressed the importance of regular exercise.   -Substance Abuse: Discussed cessation/primary prevention of tobacco, alcohol, or other drug use; driving or other dangerous activities under the influence; availability of treatment for abuse.   -Injury prevention: Discussed safety belts, safety helmets, smoke detector, smoking near bedding or upholstery.   -Sexuality: Discussed sexually transmitted diseases, partner selection, use of condoms, avoidance of unintended pregnancy and contraceptive alternatives.   -Dental health: Discussed importance of regular tooth brushing, flossing, and dental visits.  -Health maintenance and immunizations reviewed. Please refer to Health maintenance section.  Return to care in 1 year for next preventative visit.     Subjective:  HPI:  He has no acute complaints today.   His chronic medical conditions are outlined below:  # Essential Hypertension -Patient was started on losartan 100 mg about 3 months ago.  Unfortunately, his pharmacy ran out of this medication and he was restarted on his previous lisinopril-HCTZ.  He has been on this for  the past several days.  Has noticed elevated blood pressure readings over that time.  Was previously well controlled on losartan.  No reported chest pain or shortness of breath.  Lifestyle Diet: Cutting back on junk food.  Exercise: Not exercising regularly.   Depression screen PHQ 2/9 01/12/2018  Decreased Interest 0  Down, Depressed, Hopeless 0  PHQ - 2 Score 0   Health Maintenance Due  Topic Date Due  . TETANUS/TDAP  02/04/2018    ROS: Per HPI, otherwise a complete review of systems was negative.   PMH:  The following were reviewed and entered/updated in epic: Past Medical History:  Diagnosis Date  . Hypertension    Patient Active Problem List   Diagnosis Date Noted  . Hypertension 09/19/2016  . Right knee pain 03/21/2015   Past Surgical History:  Procedure Laterality Date  . MOUTH SURGERY    . None      Family History  Problem Relation Age of Onset  . Hypertension Mother   . Kidney failure Father   . Diabetes Father   . Asthma Sister   . Kidney failure Maternal Grandmother   . Stroke Maternal Grandfather     Medications- reviewed and updated Current Outpatient Medications  Medication Sig Dispense Refill  . ibuprofen (ADVIL,MOTRIN) 800 MG tablet Take 1 tablet (800 mg total) by mouth every 8 (eight) hours as needed. 21 tablet 0  . amLODipine (NORVASC) 10 MG tablet Take 1 tablet (10 mg total) by mouth daily. 90 tablet 3   No current facility-administered medications for this visit.     Allergies-reviewed and updated Allergies  Allergen Reactions  . Food Swelling    WALNUTS    Social History   Socioeconomic History  . Marital status:  Married    Spouse name: Not on file  . Number of children: 2  . Years of education: Not on file  . Highest education level: Not on file  Occupational History    Comment: Owner of a cleaning company  Social Needs  . Financial resource strain: Not on file  . Food insecurity:    Worry: Not on file    Inability: Not  on file  . Transportation needs:    Medical: Not on file    Non-medical: Not on file  Tobacco Use  . Smoking status: Never Smoker  . Smokeless tobacco: Never Used  Substance and Sexual Activity  . Alcohol use: Yes    Alcohol/week: 0.0 standard drinks    Comment: socially  . Drug use: No  . Sexual activity: Yes  Lifestyle  . Physical activity:    Days per week: Not on file    Minutes per session: Not on file  . Stress: Not on file  Relationships  . Social connections:    Talks on phone: Not on file    Gets together: Not on file    Attends religious service: Not on file    Active member of club or organization: Not on file    Attends meetings of clubs or organizations: Not on file    Relationship status: Not on file  Other Topics Concern  . Not on file  Social History Narrative  . Not on file        Objective:  Physical Exam: BP (!) 154/90 (BP Location: Left Arm, Patient Position: Sitting, Cuff Size: Normal)   Pulse 67   Temp 98.6 F (37 C) (Oral)   Ht 6\' 1"  (1.854 m)   Wt 255 lb 3.2 oz (115.8 kg)   SpO2 98%   BMI 33.67 kg/m   Body mass index is 33.67 kg/m. Wt Readings from Last 3 Encounters:  04/14/18 255 lb 3.2 oz (115.8 kg)  01/12/18 260 lb (117.9 kg)  02/04/17 243 lb (110.2 kg)  Gen: NAD, resting comfortably HEENT: TMs normal bilaterally. OP clear. No thyromegaly noted.  CV: RRR with no murmurs appreciated Pulm: NWOB, CTAB with no crackles, wheezes, or rhonchi GI: Normal bowel sounds present. Soft, Nontender, Nondistended. MSK: no edema, cyanosis, or clubbing noted Skin: warm, dry Neuro: CN2-12 grossly intact. Strength 5/5 in upper and lower extremities. Reflexes symmetric and intact bilaterally.  Psych: Normal affect and thought content     Caleb M. Jimmey Ralph, MD 04/14/2018 12:19 PM

## 2018-04-14 NOTE — Patient Instructions (Addendum)
It was very nice to see you today!  Please start the amlodipine.  Side effects.    Keep an eye on your blood pressure and let me know if it is 140/90 or higher over the next few weeks.   Eat at least 3 REAL meals and 1-2 snacks per day.  Aim for no more than 5 hours between eating.  Eat breakfast within one hour of getting up.    Obtain twice as many fruits/vegetables as protein or carbohydrate foods for both lunch and dinner.   Cut down on sweet beverages. This includes juice, soda, and sweet tea.    Exercise at least 150 minutes every week.    Take care, Dr Jerline Pain   [facility] Eating Plan DASH stands for "Dietary Approaches to Stop Hypertension." The DASH eating plan is a healthy eating plan that has been shown to reduce high blood pressure (hypertension). It may also reduce your risk for type 2 diabetes, heart disease, and stroke. The DASH eating plan may also help with weight loss. What are tips for following this plan?  General guidelines  Avoid eating more than 2,300 mg (milligrams) of salt (sodium) a day. If you have hypertension, you may need to reduce your sodium intake to 1,500 mg a day.  Limit alcohol intake to no more than 1 drink a day for nonpregnant women and 2 drinks a day for men. One drink equals 12 oz of beer, 5 oz of wine, or 1 oz of hard liquor.  Work with your health care provider to maintain a healthy body weight or to lose weight. Ask what an ideal weight is for you.  Get at least 30 minutes of exercise that causes your heart to beat faster (aerobic exercise) most days of the week. Activities may include walking, swimming, or biking.  Work with your health care provider or diet and nutrition specialist (dietitian) to adjust your eating plan to your individual calorie needs. Reading food labels   Check food labels for the amount of sodium per serving. Choose foods with less than 5 percent of the Daily Value of sodium. Generally, foods with less than 300 mg  of sodium per serving fit into this eating plan.  To find whole grains, look for the word "whole" as the first word in the ingredient list. Shopping  Buy products labeled as "low-sodium" or "no salt added."  Buy fresh foods. Avoid canned foods and premade or frozen meals. Cooking  Avoid adding salt when cooking. Use salt-free seasonings or herbs instead of table salt or sea salt. Check with your health care provider or pharmacist before using salt substitutes.  Do not fry foods. Cook foods using healthy methods such as baking, boiling, grilling, and broiling instead.  Cook with heart-healthy oils, such as olive, canola, soybean, or sunflower oil. Meal planning  Eat a balanced diet that includes: ? 5 or more servings of fruits and vegetables each day. At each meal, try to fill half of your plate with fruits and vegetables. ? Up to 6-8 servings of whole grains each day. ? Less than 6 oz of lean meat, poultry, or fish each day. A 3-oz serving of meat is about the same size as a deck of cards. One egg equals 1 oz. ? 2 servings of low-fat dairy each day. ? A serving of nuts, seeds, or beans 5 times each week. ? Heart-healthy fats. Healthy fats called Omega-3 fatty acids are found in foods such as flaxseeds and coldwater fish, like sardines,  salmon, and mackerel.  Limit how much you eat of the following: ? Canned or prepackaged foods. ? Food that is high in trans fat, such as fried foods. ? Food that is high in saturated fat, such as fatty meat. ? Sweets, desserts, sugary drinks, and other foods with added sugar. ? Full-fat dairy products.  Do not salt foods before eating.  Try to eat at least 2 vegetarian meals each week.  Eat more home-cooked food and less restaurant, buffet, and fast food.  When eating at a restaurant, ask that your food be prepared with less salt or no salt, if possible. What foods are recommended? The items listed may not be a complete list. Talk with your  dietitian about what dietary choices are best for you. Grains Whole-grain or whole-wheat bread. Whole-grain or whole-wheat pasta. Brown rice. [place]. Bulgur. Whole-grain and low-sodium cereals. Pita bread. Low-fat, low-sodium crackers. Whole-wheat flour tortillas. Vegetables Fresh or frozen vegetables (raw, steamed, roasted, or grilled). Low-sodium or reduced-sodium tomato and vegetable juice. Low-sodium or reduced-sodium tomato sauce and tomato paste. Low-sodium or reduced-sodium canned vegetables. Fruits All fresh, dried, or frozen fruit. Canned fruit in natural juice (without added sugar). Meat and other protein foods Skinless chicken or [place]. Ground chicken or [place]. Pork with fat trimmed off. Fish and seafood. Egg whites. Dried beans, peas, or lentils. Unsalted nuts, nut butters, and seeds. Unsalted canned beans. Lean cuts of beef with fat trimmed off. Low-sodium, lean deli meat. Dairy Low-fat (1%) or fat-free (skim) milk. Fat-free, low-fat, or reduced-fat cheeses. Nonfat, low-sodium ricotta or cottage cheese. Low-fat or nonfat yogurt. Low-fat, low-sodium cheese. Fats and oils Soft margarine without trans fats. Vegetable oil. Low-fat, reduced-fat, or light mayonnaise and salad dressings (reduced-sodium). Canola, safflower, olive, soybean, and sunflower oils. Avocado. Seasoning and other foods Herbs. Spices. Seasoning mixes without salt. Unsalted popcorn and pretzels. Fat-free sweets. What foods are not recommended? The items listed may not be a complete list. Talk with your dietitian about what dietary choices are best for you. Grains Baked goods made with fat, such as croissants, muffins, or some breads. Dry pasta or rice meal packs. Vegetables Creamed or fried vegetables. Vegetables in a cheese sauce. Regular canned vegetables (not low-sodium or reduced-sodium). Regular canned tomato sauce and paste (not low-sodium or reduced-sodium). Regular tomato and vegetable juice (not  low-sodium or reduced-sodium). Angie Fava. Olives. Fruits Canned fruit in a light or heavy syrup. Fried fruit. Fruit in cream or butter sauce. Meat and other protein foods Fatty cuts of meat. Ribs. Fried meat. Berniece Salines. Sausage. Bologna and other processed lunch meats. Salami. Fatback. Hotdogs. Bratwurst. Salted nuts and seeds. Canned beans with added salt. Canned or smoked fish. Whole eggs or egg yolks. Chicken or [place] with skin. Dairy Whole or 2% milk, cream, and half-and-half. Whole or full-fat cream cheese. Whole-fat or sweetened yogurt. Full-fat cheese. Nondairy creamers. Whipped toppings. Processed cheese and cheese spreads. Fats and oils Butter. Stick margarine. Lard. Shortening. Ghee. Bacon fat. Tropical oils, such as coconut, palm kernel, or palm oil. Seasoning and other foods Salted popcorn and pretzels. Onion salt, garlic salt, seasoned salt, table salt, and sea salt. Worcestershire sauce. Tartar sauce. Barbecue sauce. Teriyaki sauce. Soy sauce, including reduced-sodium. Steak sauce. Canned and packaged gravies. Fish sauce. Oyster sauce. Cocktail sauce. Horseradish that you find on the shelf. Ketchup. Mustard. Meat flavorings and tenderizers. Bouillon cubes. Hot sauce and Tabasco sauce. Premade or packaged marinades. Premade or packaged taco seasonings. Relishes. Regular salad dressings. Where to find more information:  [employer]  Heart, Lung, and Blood Institute: https://wilson-eaton.com/  American Heart Association: www.heart.org Summary  The DASH eating plan is a healthy eating plan that has been shown to reduce high blood pressure (hypertension). It may also reduce your risk for type 2 diabetes, heart disease, and stroke.  With the DASH eating plan, you should limit salt (sodium) intake to 2,300 mg a day. If you have hypertension, you may need to reduce your sodium intake to 1,500 mg a day.  When on the DASH eating plan, aim to eat more fresh fruits and vegetables, whole grains, lean proteins,  low-fat dairy, and heart-healthy fats.  Work with your health care provider or diet and nutrition specialist (dietitian) to adjust your eating plan to your individual calorie needs. This information is not intended to replace advice given to you by your health care provider. Make sure you discuss any questions you have with your health care provider. Document Released: 01/10/2011 Document Revised: 01/15/2016 Document Reviewed: 01/15/2016 Elsevier Interactive Patient Education  2019 [place] 18-39 Years, Male Preventive care refers to lifestyle choices and visits with your health care provider that can promote health and wellness. What does preventive care include?   A yearly physical exam. This is also called an annual well check.  Dental exams once or twice a year.  Routine eye exams. Ask your health care provider how often you should have your eyes checked.  Personal lifestyle choices, including: ? Daily care of your teeth and gums. ? Regular physical activity. ? Eating a healthy diet. ? Avoiding tobacco and drug use. ? Limiting alcohol use. ? Practicing safe sex. What happens during an annual well check? The services and screenings done by your health care provider during your annual well check will depend on your age, overall health, lifestyle risk factors, and family history of disease. Counseling Your health care provider may ask you questions about your:  Alcohol use.  Tobacco use.  Drug use.  Emotional well-being.  Home and relationship well-being.  Sexual activity.  Eating habits.  Work and work Statistician. Screening You may have the following tests or measurements:  Height, weight, and BMI.  Blood pressure.  Lipid and cholesterol levels. These may be checked every 5 years starting at age 49.  Diabetes screening. This is done by checking your blood sugar (glucose) after you have not eaten for a while (fasting).  Skin  check.  Hepatitis C blood test.  Hepatitis B blood test.  Sexually transmitted disease (STD) testing. Discuss your test results, treatment options, and if necessary, the need for more tests with your health care provider. Vaccines Your health care provider may recommend certain vaccines, such as:  Influenza vaccine. This is recommended every year.  Tetanus, diphtheria, and acellular pertussis (Tdap, Td) vaccine. You may need a Td booster every 10 years.  Varicella vaccine. You may need this if you have not been vaccinated.  HPV vaccine. If you are 19 or younger, you may need three doses over 6 months.  Measles, mumps, and rubella (MMR) vaccine. You may need at least one dose of MMR.You may also need a second dose.  Pneumococcal 13-valent conjugate (PCV13) vaccine. You may need this if you have certain conditions and have not been vaccinated.  Pneumococcal polysaccharide (PPSV23) vaccine. You may need one or two doses if you smoke cigarettes or if you have certain conditions.  Meningococcal vaccine. One dose is recommended if you are age 90-21 years and a first-year college student living in a  residence hall, or if you have one of several medical conditions. You may also need additional booster doses.  Hepatitis A vaccine. You may need this if you have certain conditions or if you travel or work in places where you may be exposed to hepatitis A.  Hepatitis B vaccine. You may need this if you have certain conditions or if you travel or work in places where you may be exposed to hepatitis B.  Haemophilus influenzae type b (Hib) vaccine. You may need this if you have certain risk factors. Talk to your health care provider about which screenings and vaccines you need and how often you need them. This information is not intended to replace advice given to you by your health care provider. Make sure you discuss any questions you have with your health care provider. Document Released:  03/19/2001 Document Revised: 09/03/2016 Document Reviewed: 11/22/2014 Elsevier Interactive Patient Education  2019 [employer].

## 2018-11-06 IMAGING — CR DG FOOT COMPLETE 3+V*R*
3 series · 3 of 3 positions shown · non-contrast
Comparison: None.

CLINICAL DATA: Pt was hiking today and felt a pop in his right foot
across the top of all metacarpals. Nothing was dropped on the pt's
right foot. Pt can hardly bare weight on right foot.

EXAM:
RIGHT FOOT COMPLETE - 3+ VIEW

[t foot ap right]
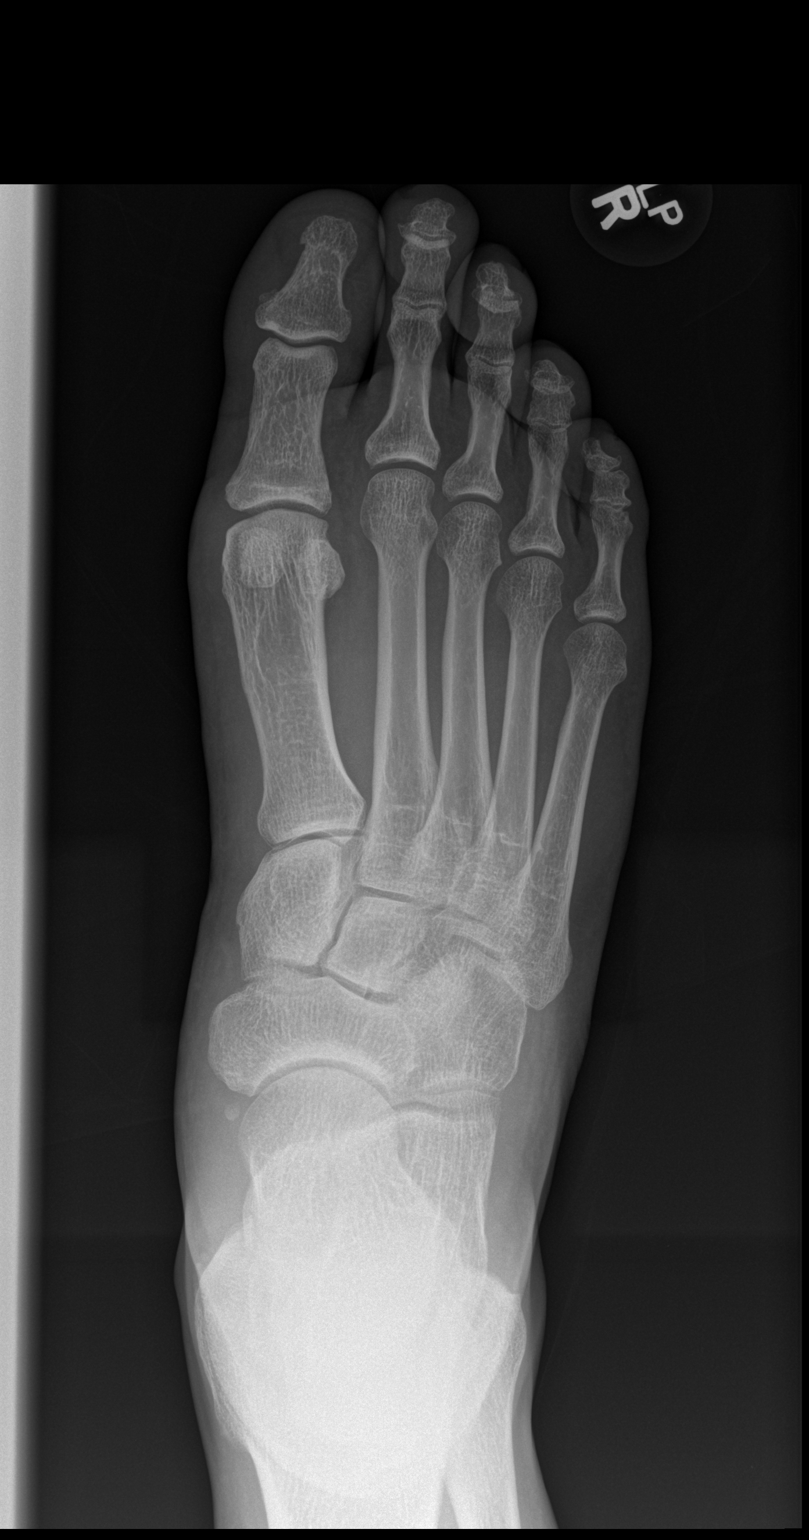

[t foot obl right]
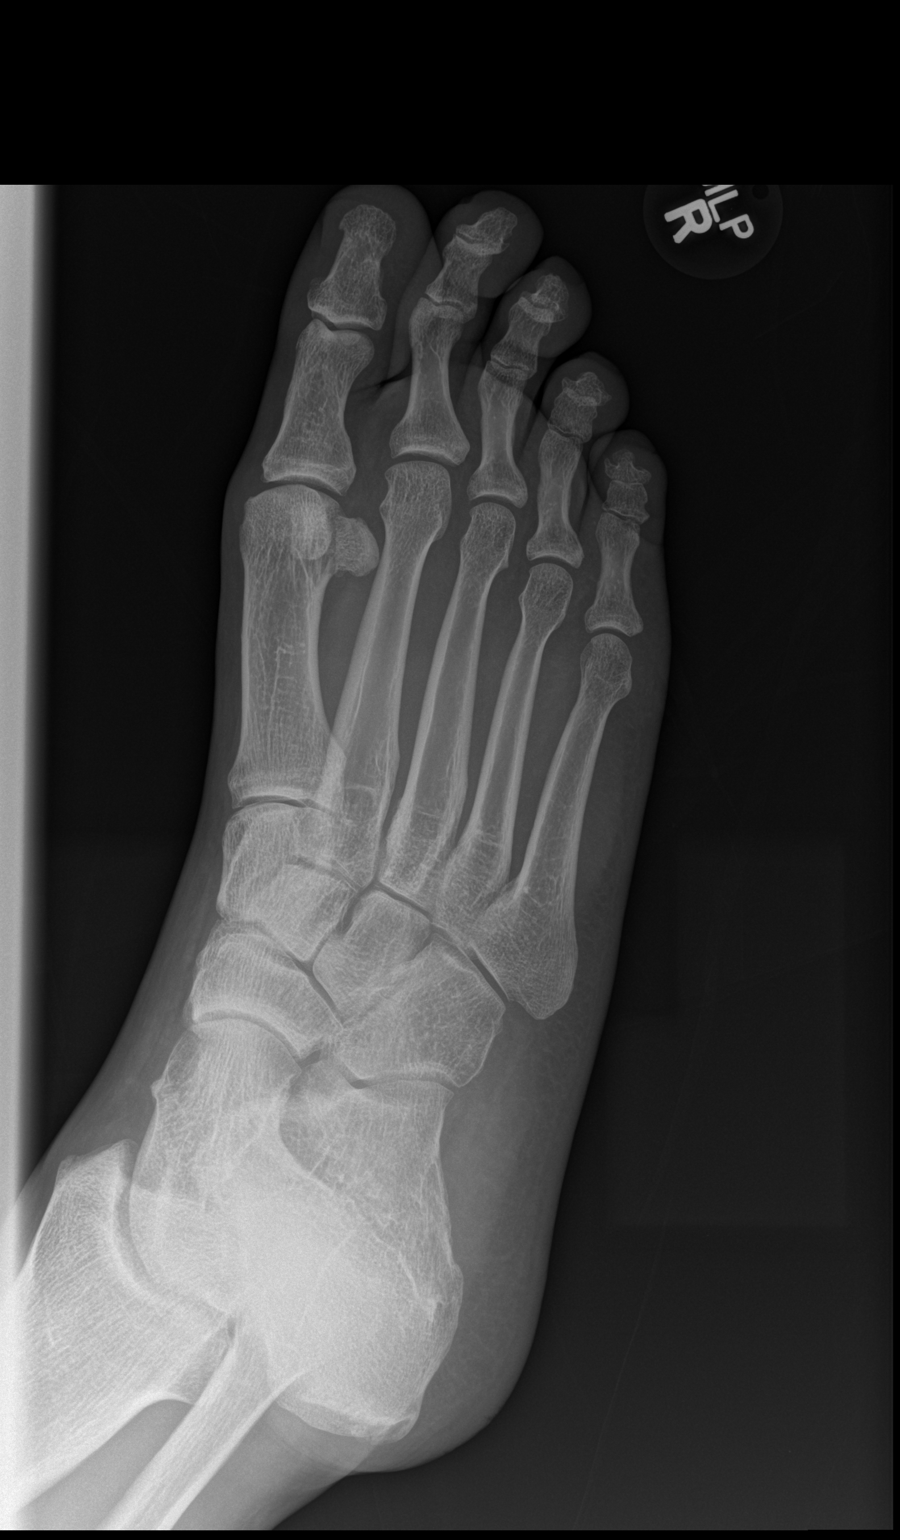

[t foot lat right]
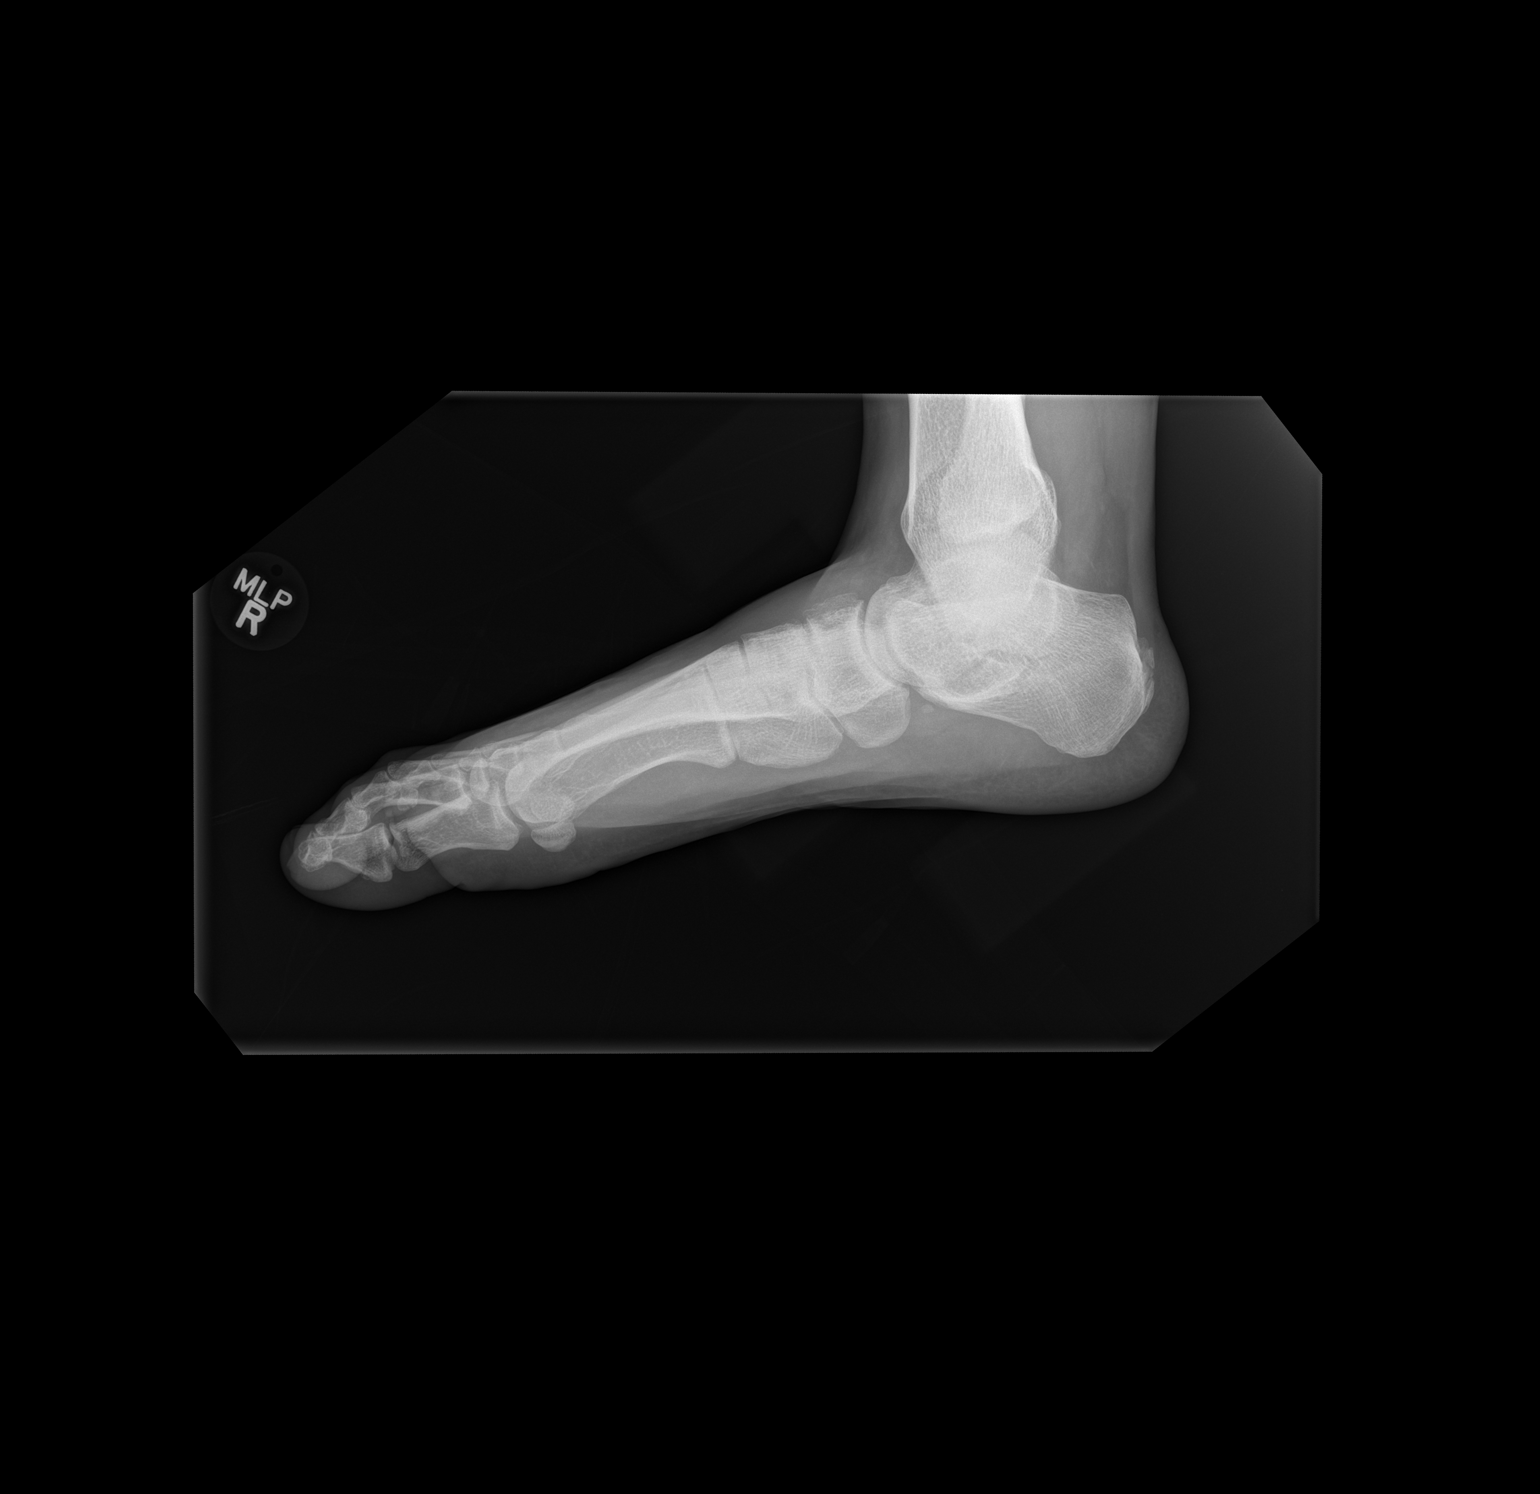

[3 of 3 positions shown; findings below may reference images not displayed]

FINDINGS: There is no evidence of fracture or dislocation. There is no
evidence of arthropathy or other focal bone abnormality. Soft
tissues are unremarkable.
IMPRESSION: Negative.

## 2019-02-16 MED FILL — AMLODIPINE BESYLATE 10 MG T: 10 | 90 days supply | Qty: 90 | Fill #0

## 2019-06-04 ENCOUNTER — Other Ambulatory Visit: Payer: Self-pay | Admitting: Family Medicine

## 2019-06-07 ENCOUNTER — Telehealth: Payer: Self-pay | Admitting: Family Medicine

## 2019-06-07 ENCOUNTER — Other Ambulatory Visit: Payer: Self-pay | Admitting: *Deleted

## 2019-06-07 MED ORDER — AMLODIPINE BESYLATE 10 MG PO TABS: 10.0000 mg | ORAL_TABLET | Freq: Every day | ORAL | 3 refills | Status: DC

## 2019-06-07 MED FILL — AMLODIPINE BESYLATE 10 MG T: 10 | 90 days supply | Qty: 90 | Fill #0

## 2019-06-07 NOTE — Telephone Encounter (Signed)
Rx send to [facility]

## 2019-06-07 NOTE — Telephone Encounter (Signed)
MEDICATION: amLODipine (NORVASC) 10 MG tablet  PHARMACY:  [facility] - [place], [place] - [address] [place] Phone:  [phone]  Fax:  [phone]       Comments:  Patient is completely out. Patient has made appt for his CPE for May 17th.        **Let patient know to contact pharmacy at the end of the day to make sure medication is ready. **  ** Please notify patient to allow 48-72 hours to process**  **Encourage patient to contact the pharmacy for refills or they can request refills through [facility]**

## 2019-06-21 ENCOUNTER — Encounter: Payer: No Typology Code available for payment source | Admitting: Family Medicine

## 2019-06-21 DIAGNOSIS — Z0289 Encounter for other administrative examinations: Secondary | ICD-10-CM

## 2019-07-08 ENCOUNTER — Encounter: Payer: No Typology Code available for payment source | Admitting: Family Medicine

## 2019-08-02 ENCOUNTER — Encounter: Payer: No Typology Code available for payment source | Admitting: Family Medicine

## 2019-08-13 ENCOUNTER — Ambulatory Visit (INDEPENDENT_AMBULATORY_CARE_PROVIDER_SITE_OTHER): Payer: No Typology Code available for payment source | Admitting: Family Medicine

## 2019-08-13 ENCOUNTER — Other Ambulatory Visit: Payer: Self-pay

## 2019-08-13 ENCOUNTER — Encounter: Payer: Self-pay | Admitting: Family Medicine

## 2019-08-13 ENCOUNTER — Other Ambulatory Visit (HOSPITAL_COMMUNITY): Payer: Self-pay | Admitting: Family Medicine

## 2019-08-13 VITALS — BP 160/98 | HR 96 | Temp 98.2°F | Ht 73.0 in | Wt 262.0 lb

## 2019-08-13 DIAGNOSIS — Z6834 Body mass index (BMI) 34.0-34.9, adult: Secondary | ICD-10-CM | POA: Diagnosis not present

## 2019-08-13 DIAGNOSIS — Z0001 Encounter for general adult medical examination with abnormal findings: Secondary | ICD-10-CM | POA: Diagnosis not present

## 2019-08-13 DIAGNOSIS — I10 Essential (primary) hypertension: Secondary | ICD-10-CM | POA: Diagnosis not present

## 2019-08-13 DIAGNOSIS — E669 Obesity, unspecified: Secondary | ICD-10-CM

## 2019-08-13 DIAGNOSIS — Z13220 Encounter for screening for lipoid disorders: Secondary | ICD-10-CM

## 2019-08-13 LAB — CBC
HCT: 44.1 % (ref 39.0–52.0)
Hemoglobin: 14.8 g/dL (ref 13.0–17.0)
MCHC: 33.7 g/dL (ref 30.0–36.0)
MCV: 86.9 fl (ref 78.0–100.0)
Platelets: 303 10*3/uL (ref 150.0–400.0)
RBC: 5.07 Mil/uL (ref 4.22–5.81)
RDW: 13.7 % (ref 11.5–15.5)
WBC: 8.2 10*3/uL (ref 4.0–10.5)

## 2019-08-13 LAB — COMPREHENSIVE METABOLIC PANEL
ALT: 17 U/L (ref 0–53)
AST: 23 U/L (ref 0–37)
Albumin: 4.6 g/dL (ref 3.5–5.2)
Alkaline Phosphatase: 74 U/L (ref 39–117)
BUN: 11 mg/dL (ref 6–23)
CO2: 26 mEq/L (ref 19–32)
Calcium: 9.7 mg/dL (ref 8.4–10.5)
Chloride: 103 mEq/L (ref 96–112)
Creatinine, Ser: 1.01 mg/dL (ref 0.40–1.50)
GFR: 100.69 mL/min (ref >60.00)
Glucose, Bld: 107 mg/dL — ABNORMAL HIGH (ref 70–99)
Potassium: 3.6 mEq/L (ref 3.5–5.1)
Sodium: 139 mEq/L (ref 135–145)
Total Bilirubin: 0.6 mg/dL (ref 0.2–1.2)
Total Protein: 7 g/dL (ref 6.0–8.3)

## 2019-08-13 LAB — LIPID PANEL
Cholesterol: 144 mg/dL (ref 0–200)
HDL: 47.9 mg/dL (ref >39.00)
LDL Cholesterol: 78 mg/dL (ref 0–99)
NonHDL: 96.39
Total CHOL/HDL Ratio: 3
Triglycerides: 91 mg/dL (ref 0.0–149.0)
VLDL: 18.2 mg/dL (ref 0.0–40.0)

## 2019-08-13 LAB — TSH: TSH: 0.87 u[IU]/mL (ref 0.35–4.50)

## 2019-08-13 MED ORDER — LOSARTAN POTASSIUM-HCTZ 100-25 MG PO TABS: 1.0000 | ORAL_TABLET | Freq: Every day | ORAL | 3 refills | Status: DC

## 2019-08-13 MED FILL — LOSARTAN-HCTZ 100-25 MG TAB: 100-25 | 90 days supply | Qty: 90 | Fill #0

## 2019-08-13 NOTE — Patient Instructions (Signed)
It was very nice to see you today!  We will switch you from amlodipine back to losartan.  This should help with your leg swelling.  Please keep an eye on your blood pressure and let me know if persistently 140/90 or higher.  We will check blood work today.  Come back in 1 year for your next physical with blood work, or sooner if needed.  Take care, Dr Jerline Pain  Please try these tips to maintain a healthy lifestyle:   Eat at least 3 REAL meals and 1-2 snacks per day.  Aim for no more than 5 hours between eating.  If you eat breakfast, please do so within one hour of getting up.    Each meal should contain half fruits/vegetables, one quarter protein, and one quarter carbs (no bigger than a computer mouse)   Cut down on sweet beverages. This includes juice, soda, and sweet tea.     Drink at least 1 glass of water with each meal and aim for at least 8 glasses per day   Exercise at least 150 minutes every week.    Preventive Care 73-67 Years Old, Male Preventive care refers to lifestyle choices and visits with your health care provider that can promote health and wellness. This includes:  A yearly physical exam. This is also called an annual well check.  Regular dental and eye exams.  Immunizations.  Screening for certain conditions.  Healthy lifestyle choices, such as eating a healthy diet, getting regular exercise, not using drugs or products that contain nicotine and tobacco, and limiting alcohol use. What can I expect for my preventive care visit? Physical exam Your health care provider will check:  Height and weight. These may be used to calculate body mass index (BMI), which is a measurement that tells if you are at a healthy weight.  Heart rate and blood pressure.  Your skin for abnormal spots. Counseling Your health care provider may ask you questions about:  Alcohol, tobacco, and drug use.  Emotional well-being.  Home and relationship well-being.  Sexual  activity.  Eating habits.  Work and work Statistician. What immunizations do I need?  Influenza (flu) vaccine  This is recommended every year. Tetanus, diphtheria, and pertussis (Tdap) vaccine  You may need a Td booster every 10 years. Varicella (chickenpox) vaccine  You may need this vaccine if you have not already been vaccinated. Human papillomavirus (HPV) vaccine  If recommended by your health care provider, you may need three doses over 6 months. Measles, mumps, and rubella (MMR) vaccine  You may need at least one dose of MMR. You may also need a second dose. Meningococcal conjugate (MenACWY) vaccine  One dose is recommended if you are 78-4 years old and a Market researcher living in a residence hall, or if you have one of several medical conditions. You may also need additional booster doses. Pneumococcal conjugate (PCV13) vaccine  You may need this if you have certain conditions and were not previously vaccinated. Pneumococcal polysaccharide (PPSV23) vaccine  You may need one or two doses if you smoke cigarettes or if you have certain conditions. Hepatitis A vaccine  You may need this if you have certain conditions or if you travel or work in places where you may be exposed to hepatitis A. Hepatitis B vaccine  You may need this if you have certain conditions or if you travel or work in places where you may be exposed to hepatitis B. Haemophilus influenzae type b (Hib) vaccine  You may need this if you have certain risk factors. You may receive vaccines as individual doses or as more than one vaccine together in one shot (combination vaccines). Talk with your health care provider about the risks and benefits of combination vaccines. What tests do I need? Blood tests  Lipid and cholesterol levels. These may be checked every 5 years starting at age 53.  Hepatitis C test.  Hepatitis B test. Screening   Diabetes screening. This is done by checking your  blood sugar (glucose) after you have not eaten for a while (fasting).  Sexually transmitted disease (STD) testing. Talk with your health care provider about your test results, treatment options, and if necessary, the need for more tests. Follow these instructions at home: Eating and drinking   Eat a diet that includes fresh fruits and vegetables, whole grains, lean protein, and low-fat dairy products.  Take vitamin and mineral supplements as recommended by your health care provider.  Do not drink alcohol if your health care provider tells you not to drink.  If you drink alcohol: ? Limit how much you have to 0-2 drinks a day. ? Be aware of how much alcohol is in your drink. In the [place] one drink equals one 12 oz bottle of beer (355 mL), one 5 oz glass of wine (148 mL), or one 1 oz glass of hard liquor (44 mL). Lifestyle  Take daily care of your teeth and gums.  Stay active. Exercise for at least 30 minutes on 5 or more days each week.  Do not use any products that contain nicotine or tobacco, such as cigarettes, e-cigarettes, and chewing tobacco. If you need help quitting, ask your health care provider.  If you are sexually active, practice safe sex. Use a condom or other form of protection to prevent STIs (sexually transmitted infections). What's next?  Go to your health care provider once a year for a well check visit.  Ask your health care provider how often you should have your eyes and teeth checked.  Stay up to date on all vaccines. This information is not intended to replace advice given to you by your health care provider. Make sure you discuss any questions you have with your health care provider. Document Revised: 01/15/2018 Document Reviewed: 01/15/2018 Elsevier Patient Education  2020 [employer].

## 2019-08-13 NOTE — Assessment & Plan Note (Signed)
Above goal today.  Has also had some leg swelling.  We will stop amlodipine.  Restart losartan-HCTZ 100-25 once daily.  Check CBC, CMET, TSH today.  Advised home blood pressure monitoring goal 140/90 or lower.

## 2019-08-13 NOTE — Progress Notes (Signed)
Chief Complaint:  Timothy Sellers is a 39 y.o. male who presents today for his annual comprehensive physical exam.    Assessment/Plan:  Chronic Problems Addressed Today: Hypertension Above goal today.  Has also had some leg swelling.  We will stop amlodipine.  Restart losartan-HCTZ 100-25 once daily.  Check CBC, CMET, TSH today.  Advised home blood pressure monitoring goal 140/90 or lower.  Body mass index is 34.57 kg/m. / Obese  BMI Metric Follow Up - 08/13/19 0958      BMI Metric Follow Up-Please document annually   BMI Metric Follow Up Education provided           Preventative Healthcare: Check CBC, CMET, TSH, lipid panel.  Patient Counseling(The following topics were reviewed and/or handout was given):  -Nutrition: Stressed importance of moderation in sodium/caffeine intake, saturated fat and cholesterol, caloric balance, sufficient intake of fresh fruits, vegetables, and fiber.  -Stressed the importance of regular exercise.   -Substance Abuse: Discussed cessation/primary prevention of tobacco, alcohol, or other drug use; driving or other dangerous activities under the influence; availability of treatment for abuse.   -Injury prevention: Discussed safety belts, safety helmets, smoke detector, smoking near bedding or upholstery.   -Sexuality: Discussed sexually transmitted diseases, partner selection, use of condoms, avoidance of unintended pregnancy and contraceptive alternatives.   -Dental health: Discussed importance of regular tooth brushing, flossing, and dental visits.  -Health maintenance and immunizations reviewed. Please refer to Health maintenance section.  Return to care in 1 year for next preventative visit.     Subjective:  HPI:  He has no acute complaints today.   He has been under quite a bit of stress recently due to work issues.  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  Lifestyle Diet: No specific  diets. Trying to get better with timing.  Exercise: Limited due to stress at work.   Depression screen [facility] 2/9 08/13/2019  Decreased Interest 0  Down, Depressed, Hopeless 0  PHQ - 2 Score 0  Altered sleeping 0  Tired, decreased energy 0  Change in appetite 0  Feeling bad or failure about yourself  0  Trouble concentrating 0  Moving slowly or fidgety/restless 0  Suicidal thoughts 0  PHQ-9 Score 0    Health Maintenance Due  Topic Date Due  . Hepatitis C Screening  Never done     ROS: Per HPI, otherwise a complete review of systems was negative.   PMH:  The following were reviewed and entered/updated in epic: Past Medical History:  Diagnosis Date  . Hypertension    Patient Active Problem List   Diagnosis Date Noted  . Hypertension 09/19/2016  . Right knee pain 03/21/2015   Past Surgical History:  Procedure Laterality Date  . MOUTH SURGERY    . None      Family History  Problem Relation Age of Onset  . Hypertension Mother   . Kidney failure Father   . Diabetes Father   . Asthma Sister   . Kidney failure Maternal Grandmother   . Stroke Maternal Grandfather     Medications- reviewed and updated Current Outpatient Medications  Medication Sig Dispense Refill  . losartan-hydrochlorothiazide (HYZAAR) 100-25 MG tablet Take 1 tablet by mouth daily. 90 tablet 3   No current facility-administered medications for this visit.    Allergies-reviewed and updated Allergies  Allergen Reactions  . Food Swelling    WALNUTS  . Hydrocodone-Acetaminophen Hives  Social History   Socioeconomic History  . Marital status: Married    Spouse name: Not on file  . Number of children: 2  . Years of education: Not on file  . Highest education level: Not on file  Occupational History    Comment: Owner of a cleaning company  Tobacco Use  . Smoking status: Never Smoker  . Smokeless tobacco: Never Used  Vaping Use  . Vaping Use: Never used  Substance and Sexual Activity  .  Alcohol use: Yes    Alcohol/week: 0.0 standard drinks    Comment: socially  . Drug use: No  . Sexual activity: Yes  Other Topics Concern  . Not on file  Social History Narrative  . Not on file   Social Determinants of Health   Financial Resource Strain:   . Difficulty of Paying Living Expenses:   Food Insecurity:   . Worried About Programme researcher, broadcasting/film/video in the Last Year:   . Barista in the Last Year:   Transportation Needs:   . Freight forwarder (Medical):   Marland Kitchen Lack of Transportation (Non-Medical):   Physical Activity:   . Days of Exercise per Week:   . Minutes of Exercise per Session:   Stress:   . Feeling of Stress :   Social Connections:   . Frequency of Communication with Friends and Family:   . Frequency of Social Gatherings with Friends and Family:   . Attends Religious Services:   . Active Member of Clubs or Organizations:   . Attends Banker Meetings:   Marland Kitchen Marital Status:         Objective:  Physical Exam: BP (!) 160/98   Pulse 96   Temp 98.2 F (36.8 C)   Ht 6\' 1"  (1.854 m)   Wt 262 lb (118.8 kg)   SpO2 92%   BMI 34.57 kg/m   Body mass index is 34.57 kg/m. Wt Readings from Last 3 Encounters:  08/13/19 262 lb (118.8 kg)  04/14/18 255 lb 3.2 oz (115.8 kg)  01/12/18 260 lb (117.9 kg)   Gen: NAD, resting comfortably HEENT: TMs normal bilaterally. OP clear. No thyromegaly noted.  CV: RRR with no murmurs appreciated Pulm: NWOB, CTAB with no crackles, wheezes, or rhonchi GI: Normal bowel sounds present. Soft, Nontender, Nondistended. MSK: 1+ pitting edema bilaterally, cyanosis, or clubbing noted Skin: warm, dry Neuro: CN2-12 grossly intact. Strength 5/5 in upper and lower extremities. Reflexes symmetric and intact bilaterally.  Psych: Normal affect and thought content     Arshi Duarte M. 14/09/19, MD 08/13/2019 9:59 AM

## 2019-08-17 ENCOUNTER — Encounter: Payer: Self-pay | Admitting: Family Medicine

## 2019-08-17 DIAGNOSIS — R739 Hyperglycemia, unspecified: Secondary | ICD-10-CM | POA: Insufficient documentation

## 2019-08-17 DIAGNOSIS — R7303 Prediabetes: Secondary | ICD-10-CM | POA: Insufficient documentation

## 2019-08-17 NOTE — Progress Notes (Signed)
Please inform patient of the following:  Blood sugar level is borderline but everything else is STABLE. Do not need to make any changes to his treatment plan at this time. Would like for him to continue working on diet and exercise and we can recheck next year.  Katina Degree. Jimmey Ralph, MD 08/17/2019 9:07 AM

## 2019-08-30 ENCOUNTER — Encounter: Payer: No Typology Code available for payment source | Admitting: Family Medicine

## 2019-09-07 ENCOUNTER — Ambulatory Visit: Payer: No Typology Code available for payment source | Admitting: Family Medicine

## 2019-09-07 DIAGNOSIS — Z0289 Encounter for other administrative examinations: Secondary | ICD-10-CM

## 2019-09-09 ENCOUNTER — Encounter: Payer: Self-pay | Admitting: Family Medicine

## 2019-09-09 ENCOUNTER — Ambulatory Visit (INDEPENDENT_AMBULATORY_CARE_PROVIDER_SITE_OTHER): Payer: No Typology Code available for payment source | Admitting: Family Medicine

## 2019-09-09 ENCOUNTER — Other Ambulatory Visit: Payer: Self-pay | Admitting: Family Medicine

## 2019-09-09 ENCOUNTER — Other Ambulatory Visit: Payer: Self-pay

## 2019-09-09 VITALS — BP 154/102 | HR 72 | Temp 98.1°F | Ht 73.0 in | Wt 262.2 lb

## 2019-09-09 DIAGNOSIS — I10 Essential (primary) hypertension: Secondary | ICD-10-CM

## 2019-09-09 MED ORDER — AMLODIPINE-OLMESARTAN 10-40 MG PO TABS: 1.0000 | ORAL_TABLET | Freq: Every day | ORAL | 5 refills | Status: DC

## 2019-09-09 MED FILL — AMLODIPINE-OLMESARTAN 10-40: 10-40 | 30 days supply | Qty: 30 | Fill #0

## 2019-09-09 NOTE — Patient Instructions (Signed)
It was very nice to see you today!  We will start a combination blood pressure medication.  Please check your blood pressure for the next 2 weeks and check in with me on MyChart.  Take care, Dr Jimmey Ralph  Please try these tips to maintain a healthy lifestyle:   Eat at least 3 REAL meals and 1-2 snacks per day.  Aim for no more than 5 hours between eating.  If you eat breakfast, please do so within one hour of getting up.    Each meal should contain half fruits/vegetables, one quarter protein, and one quarter carbs (no bigger than a computer mouse)   Cut down on sweet beverages. This includes juice, soda, and sweet tea.     Drink at least 1 glass of water with each meal and aim for at least 8 glasses per day   Exercise at least 150 minutes every week.

## 2019-09-09 NOTE — Assessment & Plan Note (Signed)
Uncontrolled.  Discussed lifestyle interventions.  We will start amlodipine-olmesartan 10-40 once daily.  He will continue working on diet and exercise changes.  He has been under quite a bit of stress recently which is also likely contributing.  He will continue home monitoring with goal 140/90 or lower and he will check in with me in a couple weeks via MyChart.

## 2019-09-09 NOTE — Progress Notes (Signed)
   Weaver Tweed is a 39 y.o. male who presents today for an office visit.  Assessment/Plan:  Chronic Problems Addressed Today: Hypertension Uncontrolled.  Discussed lifestyle interventions.  We will start amlodipine-olmesartan 10-40 once daily.  He will continue working on diet and exercise changes.  He has been under quite a bit of stress recently which is also likely contributing.  He will continue home monitoring with goal 140/90 or lower and he will check in with me in a couple weeks via MyChart.     Subjective:  HPI:  Patient here for blood pressure follow up. HE was seen about a month ago. We started him on losartan-HCTZ and stopped amlodipine due to side effects. Started feeling "funky" after starting losartan-HCTZ. Started having nose bleeds and dry mouth and stopped taking medications. Has been back on amlodipine for a few weeks.        Objective:  Physical Exam: BP (!) 154/102   Pulse 72   Temp 98.1 F (36.7 C)   Ht 6\' 1"  (1.854 m)   Wt 262 lb 3.2 oz (118.9 kg)   SpO2 97%   BMI 34.59 kg/m   Gen: No acute distress, resting comfortably CV: Regular rate and rhythm with no murmurs appreciated Pulm: Normal work of breathing, clear to auscultation bilaterally with no crackles, wheezes, or rhonchi Neuro: Grossly normal, moves all extremities Psych: Normal affect and thought content      Kenyada Dosch M. , MD 09/09/2019 10:32 AM

## 2019-10-07 MED FILL — AMLODIPINE-OLMESARTAN 10-40: 10-40 | 30 days supply | Qty: 30 | Fill #0

## 2019-11-09 MED FILL — AMLODIPINE-OLMESARTAN 10-40: 10-40 | 30 days supply | Qty: 30 | Fill #1

## 2019-12-11 MED FILL — AMLODIPINE-OLMESARTAN 10-40: 10-40 | 30 days supply | Qty: 30 | Fill #2

## 2020-01-12 MED FILL — AMLODIPINE-OLMESARTAN 10-40: 10-40 | 30 days supply | Qty: 30 | Fill #3

## 2020-02-14 MED FILL — AMLODIPINE-OLMESARTAN 10-40: 10-40 | 30 days supply | Qty: 30 | Fill #4

## 2020-03-17 MED FILL — LOSARTAN POTASSIUM 50 MG TA: 50 | 30 days supply | Qty: 60 | Fill #0

## 2020-03-17 MED FILL — HYDROCHLOROTHIAZIDE 25 MG T: 25 | 30 days supply | Qty: 30 | Fill #0

## 2020-05-22 ENCOUNTER — Other Ambulatory Visit (HOSPITAL_COMMUNITY): Payer: Self-pay

## 2020-05-22 MED FILL — Losartan Potassium Tab 50 MG: ORAL | 90 days supply | Qty: 180 | Fill #0 | Status: AC

## 2020-05-22 MED FILL — Hydrochlorothiazide Tab 25 MG: ORAL | 90 days supply | Qty: 90 | Fill #0 | Status: AC

## 2020-07-25 ENCOUNTER — Other Ambulatory Visit (HOSPITAL_COMMUNITY): Payer: Self-pay

## 2020-08-14 ENCOUNTER — Encounter: Payer: 59 | Admitting: Family Medicine

## 2020-08-24 ENCOUNTER — Other Ambulatory Visit: Payer: Self-pay | Admitting: Family Medicine

## 2020-08-24 ENCOUNTER — Other Ambulatory Visit (HOSPITAL_COMMUNITY): Payer: Self-pay

## 2020-08-24 MED ORDER — LOSARTAN POTASSIUM 50 MG PO TABS
100.0000 mg | ORAL_TABLET | Freq: Every day | ORAL | 0 refills | Status: DC
  Filled 2020-08-24 – 2020-08-25 (×2): qty 60, 30d supply, fill #0

## 2020-08-24 MED ORDER — HYDROCHLOROTHIAZIDE 25 MG PO TABS
25.0000 mg | ORAL_TABLET | Freq: Every day | ORAL | 0 refills | Status: DC
  Filled 2020-08-24 – 2020-08-25 (×2): qty 30, 30d supply, fill #0

## 2020-08-25 ENCOUNTER — Other Ambulatory Visit (HOSPITAL_COMMUNITY): Payer: Self-pay

## 2020-08-25 ENCOUNTER — Other Ambulatory Visit (HOSPITAL_BASED_OUTPATIENT_CLINIC_OR_DEPARTMENT_OTHER): Payer: Self-pay

## 2020-09-25 ENCOUNTER — Telehealth: Payer: Self-pay

## 2020-09-25 ENCOUNTER — Other Ambulatory Visit (HOSPITAL_COMMUNITY): Payer: Self-pay

## 2020-09-25 MED ORDER — LOSARTAN POTASSIUM 50 MG PO TABS
100.0000 mg | ORAL_TABLET | Freq: Every day | ORAL | 0 refills | Status: DC
  Filled 2020-09-25: qty 60, 30d supply, fill #0

## 2020-09-25 MED ORDER — HYDROCHLOROTHIAZIDE 25 MG PO TABS
25.0000 mg | ORAL_TABLET | Freq: Every day | ORAL | 0 refills | Status: DC
  Filled 2020-09-25: qty 60, 60d supply, fill #0

## 2020-09-25 NOTE — Telephone Encounter (Signed)
LAST APPOINTMENT DATE:  09/09/19  NEXT APPOINTMENT DATE:11/09/20  MEDICATION:losartan (COZAAR) 50 MG tablet hydrochlorothiazide (HYDRODIURIL) 25 MG tablet  PHARMACY:[facility]

## 2020-09-25 NOTE — Telephone Encounter (Signed)
Refills sent to get pt to up coming appointment.

## 2020-11-09 ENCOUNTER — Telehealth: Payer: Self-pay

## 2020-11-09 ENCOUNTER — Encounter: Payer: 59 | Admitting: Family Medicine

## 2020-11-09 NOTE — Telephone Encounter (Signed)
FYI

## 2020-11-09 NOTE — Telephone Encounter (Signed)
FYI  Patient called in to day to cancel 1pm appt at 12:30pm.    I have informed patient he would receive a no show fee and he understood.    States he could not leave work site.  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

## 2020-11-09 NOTE — Progress Notes (Incomplete)
Chief Complaint:  Timothy Sellers is a 40 y.o. male who presents today for his annual comprehensive physical exam.    Assessment/Plan:  New/Acute Problems: ***  Chronic Problems Addressed Today: No problem-specific Assessment & Plan notes found for this encounter.   There is no height or weight on file to calculate BMI. / ***    Preventative Healthcare: ***  Patient Counseling(The following topics were reviewed and/or handout was given):  -Nutrition: Stressed importance of moderation in sodium/caffeine intake, saturated fat and cholesterol, caloric balance, sufficient intake of fresh fruits, vegetables, and fiber.  -Stressed the importance of regular exercise.   -Substance Abuse: Discussed cessation/primary prevention of tobacco, alcohol, or other drug use; driving or other dangerous activities under the influence; availability of treatment for abuse.   -Injury prevention: Discussed safety belts, safety helmets, smoke detector, smoking near bedding or upholstery.   -Sexuality: Discussed sexually transmitted diseases, partner selection, use of condoms, avoidance of unintended pregnancy and contraceptive alternatives.   -Dental health: Discussed importance of regular tooth brushing, flossing, and dental visits.  -Health maintenance and immunizations reviewed. Please refer to Health maintenance section.  Return to care in 1 year for next preventative visit.     Subjective:  HPI:  He has ***no acute complaints today.   Lifestyle Diet: *** Exercise: ***  Depression screen [facility] 2/9 08/13/2019  Decreased Interest 0  Down, Depressed, Hopeless 0  PHQ - 2 Score 0  Altered sleeping 0  Tired, decreased energy 0  Change in appetite 0  Feeling bad or failure about yourself  0  Trouble concentrating 0  Moving slowly or fidgety/restless 0  Suicidal thoughts 0  PHQ-9 Score 0    Health Maintenance Due  Topic Date Due   COVID-19 Vaccine (1) Never done   Hepatitis C Screening   Never done   TETANUS/TDAP  02/04/2018   INFLUENZA VACCINE  Never done     ROS: Per HPI, otherwise a complete review of systems was negative.   PMH:  The following were reviewed and entered/updated in epic: Past Medical History:  Diagnosis Date   Hypertension    Patient Active Problem List   Diagnosis Date Noted   Hyperglycemia 08/17/2019   Hypertension 09/19/2016   Right knee pain 03/21/2015   Past Surgical History:  Procedure Laterality Date   MOUTH SURGERY     None      Family History  Problem Relation Age of Onset   Hypertension Mother    Kidney failure Father    Diabetes Father    Asthma Sister    Kidney failure Maternal Grandmother    Stroke Maternal Grandfather     Medications- reviewed and updated Current Outpatient Medications  Medication Sig Dispense Refill   amLODipine-olmesartan (AZOR) 10-40 MG tablet TAKE 1 TABLET BY MOUTH DAILY. 30 tablet 5   hydrochlorothiazide (HYDRODIURIL) 25 MG tablet TAKE 1 TABLET BY MOUTH DAILY 60 tablet 0   losartan (COZAAR) 50 MG tablet Take 2 tablets (100 mg total) by mouth daily. 60 tablet 0   No current facility-administered medications for this visit.    Allergies-reviewed and updated Allergies  Allergen Reactions   Food Swelling    WALNUTS   Hydrocodone-Acetaminophen Hives    Social History   Socioeconomic History   Marital status: Married    Spouse name: Not on file   Number of children: 2   Years of education: Not on file   Highest education level: Not on file  Occupational History  Comment: Owner of a cleaning company  Tobacco Use   Smoking status: Never   Smokeless tobacco: Never  Vaping Use   Vaping Use: Never used  Substance and Sexual Activity   Alcohol use: Yes    Alcohol/week: 0.0 standard drinks    Comment: socially   Drug use: No   Sexual activity: Yes  Other Topics Concern   Not on file  Social History Narrative   Not on file   Social Determinants of Health   Financial Resource  Strain: Not on file  Food Insecurity: Not on file  Transportation Needs: Not on file  Physical Activity: Not on file  Stress: Not on file  Social Connections: Not on file        Objective:  Physical Exam: There were no vitals taken for this visit.  There is no height or weight on file to calculate BMI. Wt Readings from Last 3 Encounters:  09/09/19 262 lb 3.2 oz (118.9 kg)  08/13/19 262 lb (118.8 kg)  04/14/18 255 lb 3.2 oz (115.8 kg)   Gen: NAD, resting comfortably*** HEENT: TMs normal bilaterally. OP clear. No thyromegaly noted.  CV: RRR with no murmurs appreciated Pulm: NWOB, CTAB with no crackles, wheezes, or rhonchi GI: Normal bowel sounds present. Soft, Nontender, Nondistended. MSK: no edema, cyanosis, or clubbing noted Skin: warm, dry Neuro: CN2-12 grossly intact. Strength 5/5 in upper and lower extremities. Reflexes symmetric and intact bilaterally.  Psych: Normal affect and thought content      I,Savera Zaman,acting as a scribe for Jacquiline Doe, MD.,have documented all relevant documentation on the behalf of Jacquiline Doe, MD,as directed by  Jacquiline Doe, MD while in the presence of Jacquiline Doe, MD.   *** Katina Degree. Jimmey Ralph, MD 11/09/2020 7:49 AM

## 2020-12-14 ENCOUNTER — Other Ambulatory Visit: Payer: Self-pay

## 2020-12-14 ENCOUNTER — Encounter: Payer: Self-pay | Admitting: Family Medicine

## 2020-12-14 ENCOUNTER — Ambulatory Visit (INDEPENDENT_AMBULATORY_CARE_PROVIDER_SITE_OTHER): Payer: 59 | Admitting: Family Medicine

## 2020-12-14 ENCOUNTER — Other Ambulatory Visit (HOSPITAL_COMMUNITY): Payer: Self-pay

## 2020-12-14 VITALS — BP 158/94 | HR 79 | Temp 97.0°F | Ht 73.0 in | Wt 270.0 lb

## 2020-12-14 DIAGNOSIS — R739 Hyperglycemia, unspecified: Secondary | ICD-10-CM | POA: Diagnosis not present

## 2020-12-14 DIAGNOSIS — Z6835 Body mass index (BMI) 35.0-35.9, adult: Secondary | ICD-10-CM

## 2020-12-14 DIAGNOSIS — I10 Essential (primary) hypertension: Secondary | ICD-10-CM

## 2020-12-14 DIAGNOSIS — Z13220 Encounter for screening for lipoid disorders: Secondary | ICD-10-CM

## 2020-12-14 DIAGNOSIS — E669 Obesity, unspecified: Secondary | ICD-10-CM | POA: Diagnosis not present

## 2020-12-14 DIAGNOSIS — Z0001 Encounter for general adult medical examination with abnormal findings: Secondary | ICD-10-CM | POA: Diagnosis not present

## 2020-12-14 LAB — LIPID PANEL
Cholesterol: 143 mg/dL (ref 0–200)
HDL: 48.3 mg/dL (ref >39.00)
LDL Cholesterol: 84 mg/dL (ref 0–99)
NonHDL: 94.6
Total CHOL/HDL Ratio: 3
Triglycerides: 51 mg/dL (ref 0.0–149.0)
VLDL: 10.2 mg/dL (ref 0.0–40.0)

## 2020-12-14 LAB — COMPREHENSIVE METABOLIC PANEL
ALT: 20 U/L (ref 0–53)
AST: 32 U/L (ref 0–37)
Albumin: 4.5 g/dL (ref 3.5–5.2)
Alkaline Phosphatase: 56 U/L (ref 39–117)
BUN: 14 mg/dL (ref 6–23)
CO2: 30 mEq/L (ref 19–32)
Calcium: 9.4 mg/dL (ref 8.4–10.5)
Chloride: 102 mEq/L (ref 96–112)
Creatinine, Ser: 1.07 mg/dL (ref 0.40–1.50)
GFR: 88.26 mL/min (ref >60.00)
Glucose, Bld: 93 mg/dL (ref 70–99)
Potassium: 4.5 mEq/L (ref 3.5–5.1)
Sodium: 138 mEq/L (ref 135–145)
Total Bilirubin: 0.6 mg/dL (ref 0.2–1.2)
Total Protein: 7.1 g/dL (ref 6.0–8.3)

## 2020-12-14 LAB — TSH: TSH: 1.03 u[IU]/mL (ref 0.35–5.50)

## 2020-12-14 MED ORDER — LOSARTAN POTASSIUM-HCTZ 100-25 MG PO TABS
1.0000 | ORAL_TABLET | Freq: Every day | ORAL | 3 refills | Status: DC
  Filled 2020-12-14 – 2021-03-14 (×2): qty 90, 90d supply, fill #0

## 2020-12-14 NOTE — Assessment & Plan Note (Signed)
Check A1c. 

## 2020-12-14 NOTE — Assessment & Plan Note (Signed)
Above goal.  He is only taking 50 mg of losartan instead of 100mg .   We will change medication regimen.  Losartan-HCTZ 100-25 once daily.  Check labs today.  Continue home monitoring goal 140/90 or lower.  He will check with me in a few weeks via MyChart.  Discussed low-sodium diet and regular exercise.

## 2020-12-14 NOTE — Patient Instructions (Signed)
It was very nice to see you today!  We will send in combination blood pressure pill.  Please work on cutting down on sodium.  Keep an eye on your blood pressure and let me know if it is persistently 140/90 or lower.  We will check blood work today.  Come back in 1 year for your next physical.  Come back sooner if needed.  Take care, Dr Jimmey Ralph  PLEASE NOTE:  If you had any lab tests please let us know if you have not heard back within a few days. You may see your results on mychart before we have a chance to review them but we will give you a call once they are reviewed by [place]. If we ordered any referrals today, please let us know if you have not heard from their office within the next week.   Please try these tips to maintain a healthy lifestyle:  Eat at least 3 REAL meals and 1-2 snacks per day.  Aim for no more than 5 hours between eating.  If you eat breakfast, please do so within one hour of getting up.   Each meal should contain half fruits/vegetables, one quarter protein, and one quarter carbs (no bigger than a computer mouse)  Cut down on sweet beverages. This includes juice, soda, and sweet tea.   Drink at least 1 glass of water with each meal and aim for at least 8 glasses per day  Exercise at least 150 minutes every week.    Preventive Care 89-26 Years Old, Male Preventive care refers to lifestyle choices and visits with your health care provider that can promote health and wellness. Preventive care visits are also called wellness exams. What can I expect for my preventive care visit? Counseling During your preventive care visit, your health care provider may ask about your: Medical history, including: Past medical problems. Family medical history. Current health, including: Emotional well-being. Home life and relationship well-being. Sexual activity. Lifestyle, including: Alcohol, nicotine or tobacco, and drug use. Access to firearms. Diet, exercise, and sleep  habits. Safety issues such as seatbelt and bike helmet use. Sunscreen use. Work and work Astronomer. Physical exam Your health care provider may check your: Height and weight. These may be used to calculate your BMI (body mass index). BMI is a measurement that tells if you are at a healthy weight. Waist circumference. This measures the distance around your waistline. This measurement also tells if you are at a healthy weight and may help predict your risk of certain diseases, such as type 2 diabetes and high blood pressure. Heart rate and blood pressure. Body temperature. Skin for abnormal spots. What immunizations do I need? Vaccines are usually given at various ages, according to a schedule. Your health care provider will recommend vaccines for you based on your age, medical history, and lifestyle or other factors, such as travel or where you work. What tests do I need? Screening Your health care provider may recommend screening tests for certain conditions. This may include: Lipid and cholesterol levels. Diabetes screening. This is done by checking your blood sugar (glucose) after you have not eaten for a while (fasting). Hepatitis B test. Hepatitis C test. HIV (human immunodeficiency virus) test. STI (sexually transmitted infection) testing, if you are at risk. Talk with your health care provider about your test results, treatment options, and if necessary, the need for more tests. Follow these instructions at home: Eating and drinking  Eat a healthy diet that includes fresh fruits and  vegetables, whole grains, lean protein, and low-fat dairy products. Drink enough fluid to keep your urine pale yellow. Take vitamin and mineral supplements as recommended by your health care provider. Do not drink alcohol if your health care provider tells you not to drink. If you drink alcohol: Limit how much you have to 0-2 drinks a day. Know how much alcohol is in your drink. In the [place] one  drink equals one 12 oz bottle of beer (355 mL), one 5 oz glass of wine (148 mL), or one 1 oz glass of hard liquor (44 mL). Lifestyle Brush your teeth every morning and night with fluoride toothpaste. Floss one time each day. Exercise for at least 30 minutes 5 or more days each week. Do not use any products that contain nicotine or tobacco. These products include cigarettes, chewing tobacco, and vaping devices, such as e-cigarettes. If you need help quitting, ask your health care provider. Do not use drugs. If you are sexually active, practice safe sex. Use a condom or other form of protection to prevent STIs. Find healthy ways to manage stress, such as: Meditation, yoga, or listening to music. Journaling. Talking to a trusted person. Spending time with friends and family. Minimize exposure to UV radiation to reduce your risk of skin cancer. Safety Always wear your seat belt while driving or riding in a vehicle. Do not drive: If you have been drinking alcohol. Do not ride with someone who has been drinking. If you have been using any mind-altering substances or drugs. While texting. When you are tired or distracted. Wear a helmet and other protective equipment during sports activities. If you have firearms in your house, make sure you follow all gun safety procedures. Seek help if you have been physically or sexually abused. What's next? Go to your health care provider once a year for an annual wellness visit. Ask your health care provider how often you should have your eyes and teeth checked. Stay up to date on all vaccines. This information is not intended to replace advice given to you by your health care provider. Make sure you discuss any questions you have with your health care provider. Document Revised: 07/19/2020 Document Reviewed: 07/19/2020 Elsevier Patient Education  2022 [employer].

## 2020-12-14 NOTE — Progress Notes (Signed)
Chief Complaint:  Timothy Sellers is a 40 y.o. male who presents today for his annual comprehensive physical exam.    Assessment/Plan:  New/Acute Problems: Ankle Swelling No red flags.  Could be related to arthritis though has no pain.  Possibly related to remote injury.  We will continue with watchful waiting.  Discussed importance of salt avoidance.  Chronic Problems Addressed Today: Hyperglycemia Check A1c.   Hypertension Above goal.  He is only taking 50 mg of losartan instead of 100mg .   We will change medication regimen.  Losartan-HCTZ 100-25 once daily.  Check labs today.  Continue home monitoring goal 140/90 or lower.  He will check with me in a few weeks via MyChart.  Discussed low-sodium diet and regular exercise.   Body mass index is 35.62 kg/m. / Obese  BMI Metric Follow Up - 12/14/20 0954       BMI Metric Follow Up-Please document annually   BMI Metric Follow Up Education provided              Preventative Healthcare: Check labs.  Patient Counseling(The following topics were reviewed and/or handout was given):  -Nutrition: Stressed importance of moderation in sodium/caffeine intake, saturated fat and cholesterol, caloric balance, sufficient intake of fresh fruits, vegetables, and fiber.  -Stressed the importance of regular exercise.   -Substance Abuse: Discussed cessation/primary prevention of tobacco, alcohol, or other drug use; driving or other dangerous activities under the influence; availability of treatment for abuse.   -Injury prevention: Discussed safety belts, safety helmets, smoke detector, smoking near bedding or upholstery.   -Sexuality: Discussed sexually transmitted diseases, partner selection, use of condoms, avoidance of unintended pregnancy and contraceptive alternatives.   -Dental health: Discussed importance of regular tooth brushing, flossing, and dental visits.  -Health maintenance and immunizations reviewed. Please refer to Health  maintenance section.  Return to care in 1 year for next preventative visit.     Subjective:  HPI:  He has no acute complaints today.   His blood pressure at office was elevated today. He admit that he does not check his blood pressure at home. He is concerned about this issue and state he does not get enough sleep. He thinks this might be causing high blood pressure. He also had some leg swelling. Denies pain  He is currently on Losartan 50 mg daily, HCTZ 25 mg daily.   Lifestyle Diet: Trying to eat on time. Exercise: Busy with work.  Depression screen PHQ 2/9 12/14/2020  Decreased Interest 0  Down, Depressed, Hopeless 0  PHQ - 2 Score 0  Altered sleeping -  Tired, decreased energy -  Change in appetite -  Feeling bad or failure about yourself  -  Trouble concentrating -  Moving slowly or fidgety/restless -  Suicidal thoughts -  PHQ-9 Score -    Health Maintenance Due  Topic Date Due   COVID-19 Vaccine (1) Never done   Hepatitis C Screening  Never done   TETANUS/TDAP  02/04/2018     ROS: Per HPI, otherwise a complete review of systems was negative.   PMH:  The following were reviewed and entered/updated in epic: Past Medical History:  Diagnosis Date   Hypertension    Patient Active Problem List   Diagnosis Date Noted   Hyperglycemia 08/17/2019   Hypertension 09/19/2016   Right knee pain 03/21/2015   Past Surgical History:  Procedure Laterality Date   MOUTH SURGERY     None      Family History  Problem  Relation Age of Onset   Hypertension Mother    Kidney failure Father    Diabetes Father    Asthma Sister    Kidney failure Maternal Grandmother    Stroke Maternal Grandfather     Medications- reviewed and updated Current Outpatient Medications  Medication Sig Dispense Refill   losartan-hydrochlorothiazide (HYZAAR) 100-25 MG tablet Take 1 tablet by mouth daily. 90 tablet 3   No current facility-administered medications for this visit.     Allergies-reviewed and updated Allergies  Allergen Reactions   Food Swelling    WALNUTS   Hydrocodone-Acetaminophen Hives    Social History   Socioeconomic History   Marital status: Married    Spouse name: Not on file   Number of children: 2   Years of education: Not on file   Highest education level: Not on file  Occupational History    Comment: Owner of a cleaning company  Tobacco Use   Smoking status: Never   Smokeless tobacco: Never  Vaping Use   Vaping Use: Never used  Substance and Sexual Activity   Alcohol use: Yes    Alcohol/week: 0.0 standard drinks    Comment: socially   Drug use: No   Sexual activity: Yes  Other Topics Concern   Not on file  Social History Narrative   Not on file   Social Determinants of Health   Financial Resource Strain: Not on file  Food Insecurity: Not on file  Transportation Needs: Not on file  Physical Activity: Not on file  Stress: Not on file  Social Connections: Not on file        Objective:  Physical Exam: BP (!) 158/94   Pulse 79   Temp (!) 97 F (36.1 C) (Temporal)   Ht 6\' 1"  (1.854 m)   Wt 270 lb (122.5 kg)   SpO2 96%   BMI 35.62 kg/m   Body mass index is 35.62 kg/m. Wt Readings from Last 3 Encounters:  12/14/20 270 lb (122.5 kg)  09/09/19 262 lb 3.2 oz (118.9 kg)  08/13/19 262 lb (118.8 kg)   Gen: NAD, resting comfortably HEENT: TMs normal bilaterally. OP clear. No thyromegaly noted.  CV: RRR with no murmurs appreciated Pulm: NWOB, CTAB with no crackles, wheezes, or rhonchi GI: Normal bowel sounds present. Soft, Nontender, Nondistended. MSK: no edema, cyanosis, or clubbing noted Skin: warm, dry Neuro: CN2-12 grossly intact. Strength 5/5 in upper and lower extremities. Reflexes symmetric and intact bilaterally.  Psych: Normal affect and thought content      I,Savera Zaman,acting as a scribe for 10/14/19, MD.,have documented all relevant documentation on the behalf of Jacquiline Doe, MD,as  directed by  Jacquiline Doe, MD while in the presence of Jacquiline Doe, MD.   I, Jacquiline Doe, MD, have reviewed all documentation for this visit. The documentation on 12/14/20 for the exam, diagnosis, procedures, and orders are all accurate and complete.  13/10/22. Katina Degree, MD 12/14/2020 9:55 AM

## 2020-12-18 ENCOUNTER — Other Ambulatory Visit: Payer: Self-pay

## 2020-12-18 ENCOUNTER — Other Ambulatory Visit (INDEPENDENT_AMBULATORY_CARE_PROVIDER_SITE_OTHER): Payer: 59

## 2020-12-18 DIAGNOSIS — Z0001 Encounter for general adult medical examination with abnormal findings: Secondary | ICD-10-CM | POA: Diagnosis not present

## 2020-12-18 DIAGNOSIS — R739 Hyperglycemia, unspecified: Secondary | ICD-10-CM | POA: Diagnosis not present

## 2020-12-18 LAB — CBC WITH DIFFERENTIAL/PLATELET
Basophils Absolute: 0 10*3/uL (ref 0.0–0.1)
Basophils Relative: 0.5 % (ref 0.0–3.0)
Eosinophils Absolute: 0.2 10*3/uL (ref 0.0–0.7)
Eosinophils Relative: 2.3 % (ref 0.0–5.0)
HCT: 43.5 % (ref 39.0–52.0)
Hemoglobin: 14.5 g/dL (ref 13.0–17.0)
Lymphocytes Relative: 29.7 % (ref 12.0–46.0)
Lymphs Abs: 2.4 10*3/uL (ref 0.7–4.0)
MCHC: 33.4 g/dL (ref 30.0–36.0)
MCV: 86.4 fl (ref 78.0–100.0)
Monocytes Absolute: 0.7 10*3/uL (ref 0.1–1.0)
Monocytes Relative: 8.1 % (ref 3.0–12.0)
Neutro Abs: 4.8 10*3/uL (ref 1.4–7.7)
Neutrophils Relative %: 59.4 % (ref 43.0–77.0)
Platelets: 270 10*3/uL (ref 150.0–400.0)
RBC: 5.03 Mil/uL (ref 4.22–5.81)
RDW: 13.6 % (ref 11.5–15.5)
WBC: 8.1 10*3/uL (ref 4.0–10.5)

## 2020-12-18 LAB — HEMOGLOBIN A1C: Hgb A1c MFr Bld: 6.1 % (ref 4.6–6.5)

## 2020-12-19 NOTE — Progress Notes (Signed)
Please inform patient of the following:  Labs so far are normal.  Can we check status of A1c and CBC? I think he was supposed to come back to have them drawn.  Katina Degree. Jimmey Ralph, MD 12/19/2020 7:52 AM

## 2020-12-19 NOTE — Progress Notes (Signed)
Please disregard previous message regarding his labs.  His A1c is up a bit. Do not need to start medications but he should continue working on diet and exercise and we can recheck in a year.  Everything else is NORMAL.  Katina Degree. Jimmey Ralph, MD 12/19/2020 7:58 AM

## 2021-03-14 ENCOUNTER — Other Ambulatory Visit (HOSPITAL_COMMUNITY): Payer: Self-pay

## 2021-06-15 ENCOUNTER — Other Ambulatory Visit (HOSPITAL_COMMUNITY): Payer: Self-pay

## 2021-06-15 ENCOUNTER — Ambulatory Visit: Payer: 59 | Admitting: Family Medicine

## 2021-06-15 VITALS — BP 164/115 | HR 80 | Temp 98.2°F | Ht 73.0 in | Wt 266.0 lb

## 2021-06-15 DIAGNOSIS — I10 Essential (primary) hypertension: Secondary | ICD-10-CM | POA: Diagnosis not present

## 2021-06-15 DIAGNOSIS — R7303 Prediabetes: Secondary | ICD-10-CM | POA: Diagnosis not present

## 2021-06-15 MED ORDER — LOSARTAN POTASSIUM-HCTZ 100-25 MG PO TABS
1.0000 | ORAL_TABLET | Freq: Every day | ORAL | 3 refills | Status: DC
  Filled 2021-06-15: qty 90, 90d supply, fill #0
  Filled 2021-09-18: qty 90, 90d supply, fill #1
  Filled 2021-12-22: qty 90, 90d supply, fill #2
  Filled 2022-03-28: qty 90, 90d supply, fill #3
  Filled 2022-03-28: qty 90, 90d supply, fill #0

## 2021-06-15 NOTE — Progress Notes (Signed)
? ?  Timothy Sellers is a 41 y.o. male who presents today for an office visit. ? ?Assessment/Plan:  ?Chronic Problems Addressed Today: ?Prediabetes ?He has been working on diet and exercise.  We can recheck A1c with his next blood draw. ? ?Hypertension ?Uncontrolled.  He has been out of medications for couple of days.  Discussed importance of good blood pressure control.  We discussed lifestyle changes and modifications.  He will be trying to eat a healthier diet and cut back some at work.  We will refill his losartan-HCTZ 100-25.  He will continue to monitor at home and check in with me in a few weeks via MyChart. ? ?  ?Subjective:  ?HPI: ? ?See A/p for status of chronic conditions.  He has been under a lot of stress recently.  He is working 2 jobs.  He is not getting much sleep at night.  Does admit to not eating as healthy or taking care of himself as much as he should.  He has been out of blood pressure medications for the last couple of days.  ? ?   ?  ?Objective:  ?Physical Exam: ?BP (!) 164/115 (BP Location: Left Arm)   Pulse 80   Temp 98.2 ?F (36.8 ?C) (Temporal)   Ht 6\' 1"  (1.854 m)   Wt 266 lb (120.7 kg)   SpO2 96%   BMI 35.09 kg/m?   ?Gen: No acute distress, resting comfortably ?CV: Regular rate and rhythm with no murmurs appreciated ?Pulm: Normal work of breathing, clear to auscultation bilaterally with no crackles, wheezes, or rhonchi ?Neuro: Grossly normal, moves all extremities ?Psych: Normal affect and thought content ? ?   ? ? . Katina Degree, MD ?06/15/2021 9:56 AM  ?

## 2021-06-15 NOTE — Assessment & Plan Note (Signed)
Uncontrolled.  He has been out of medications for couple of days.  Discussed importance of good blood pressure control.  We discussed lifestyle changes and modifications.  He will be trying to eat a healthier diet and cut back some at work.  We will refill his losartan-HCTZ 100-25.  He will continue to monitor at home and check in with me in a few weeks via MyChart. ?

## 2021-06-15 NOTE — Patient Instructions (Signed)
It was very nice to see you today! ? ?Please restart your blood pressure medication.  Please make sure that you are getting at least 30 minutes of exercise per day. ? ?Keep an eye on blood pressure and send me a message in a few weeks to let me know how they are looking. ? ?Take care, ?Dr Jimmey Ralph ? ?PLEASE NOTE: ? ?If you had any lab tests please let us know if you have not heard back within a few days. You may see your results on mychart before we have a chance to review them but we will give you a call once they are reviewed by [place]. If we ordered any referrals today, please let us know if you have not heard from their office within the next week.  ? ?Please try these tips to maintain a healthy lifestyle: ? ?Eat at least 3 REAL meals and 1-2 snacks per day.  Aim for no more than 5 hours between eating.  If you eat breakfast, please do so within one hour of getting up.  ? ?Each meal should contain half fruits/vegetables, one quarter protein, and one quarter carbs (no bigger than a computer mouse) ? ?Cut down on sweet beverages. This includes juice, soda, and sweet tea.  ? ?Drink at least 1 glass of water with each meal and aim for at least 8 glasses per day ? ?Exercise at least 150 minutes every week.   ?

## 2021-06-15 NOTE — Assessment & Plan Note (Signed)
He has been working on diet and exercise.  We can recheck A1c with his next blood draw. ?

## 2021-09-18 ENCOUNTER — Other Ambulatory Visit (HOSPITAL_COMMUNITY): Payer: Self-pay

## 2021-10-29 ENCOUNTER — Encounter: Payer: Self-pay | Admitting: *Deleted

## 2021-12-22 ENCOUNTER — Other Ambulatory Visit (HOSPITAL_COMMUNITY): Payer: Self-pay

## 2022-01-17 ENCOUNTER — Encounter: Payer: Self-pay | Admitting: *Deleted

## 2022-03-28 ENCOUNTER — Other Ambulatory Visit (HOSPITAL_COMMUNITY): Payer: Self-pay

## 2022-04-18 ENCOUNTER — Other Ambulatory Visit (HOSPITAL_COMMUNITY): Payer: Self-pay

## 2022-06-20 ENCOUNTER — Other Ambulatory Visit (HOSPITAL_COMMUNITY): Payer: Self-pay

## 2022-06-20 ENCOUNTER — Other Ambulatory Visit: Payer: Self-pay | Admitting: Family Medicine

## 2022-06-20 MED ORDER — LOSARTAN POTASSIUM-HCTZ 100-25 MG PO TABS
1.0000 | ORAL_TABLET | Freq: Every day | ORAL | 0 refills | Status: DC
  Filled 2022-06-20: qty 30, 30d supply, fill #0

## 2022-07-24 ENCOUNTER — Telehealth: Payer: Self-pay | Admitting: Family Medicine

## 2022-07-24 NOTE — Telephone Encounter (Signed)
PT HAS CPE APPT ON 07/30/22 AND JUST NEEDS A 5 DAYS WORTH, HE IS COMPLETELY OUT   Prescription Request  07/24/2022  LOV: Visit date not found  What is the name of the medication or equipment?  losartan-hydrochlorothiazide (HYZAAR) 100-25 MG tablet   Have you contacted your pharmacy to request a refill? Yes   Which pharmacy would you like this sent to?  [place] [facility] [address] [place] [address] Phone: [phone] Fax: [phone]    Patient notified that their request is being sent to the clinical staff for review and that they should receive a response within 2 business days.   Please advise at Mobile [phone] (mobile)

## 2022-07-25 ENCOUNTER — Other Ambulatory Visit: Payer: Self-pay | Admitting: *Deleted

## 2022-07-25 ENCOUNTER — Other Ambulatory Visit (HOSPITAL_COMMUNITY): Payer: Self-pay

## 2022-07-25 ENCOUNTER — Other Ambulatory Visit: Payer: Self-pay | Admitting: Family Medicine

## 2022-07-25 MED ORDER — LOSARTAN POTASSIUM-HCTZ 100-25 MG PO TABS
1.0000 | ORAL_TABLET | Freq: Every day | ORAL | 0 refills | Status: DC
  Filled 2022-07-25: qty 30, 30d supply, fill #0

## 2022-07-25 NOTE — Telephone Encounter (Signed)
Rx send to [facility] pharmacy  ?

## 2022-07-30 ENCOUNTER — Ambulatory Visit (INDEPENDENT_AMBULATORY_CARE_PROVIDER_SITE_OTHER): Payer: 59 | Admitting: Family Medicine

## 2022-07-30 ENCOUNTER — Other Ambulatory Visit (HOSPITAL_COMMUNITY): Payer: Self-pay

## 2022-07-30 ENCOUNTER — Other Ambulatory Visit: Payer: Self-pay

## 2022-07-30 ENCOUNTER — Encounter: Payer: Self-pay | Admitting: Family Medicine

## 2022-07-30 VITALS — BP 166/102 | HR 87 | Temp 97.7°F | Wt 268.0 lb

## 2022-07-30 DIAGNOSIS — I10 Essential (primary) hypertension: Secondary | ICD-10-CM

## 2022-07-30 DIAGNOSIS — Z13220 Encounter for screening for lipoid disorders: Secondary | ICD-10-CM

## 2022-07-30 DIAGNOSIS — Z0001 Encounter for general adult medical examination with abnormal findings: Secondary | ICD-10-CM | POA: Diagnosis not present

## 2022-07-30 DIAGNOSIS — R7303 Prediabetes: Secondary | ICD-10-CM

## 2022-07-30 DIAGNOSIS — G4730 Sleep apnea, unspecified: Secondary | ICD-10-CM | POA: Diagnosis not present

## 2022-07-30 MED ORDER — LOSARTAN POTASSIUM-HCTZ 100-25 MG PO TABS
1.0000 | ORAL_TABLET | Freq: Every day | ORAL | 3 refills | Status: DC
  Filled 2022-07-30 – 2022-08-28 (×2): qty 90, 90d supply, fill #0
  Filled 2022-11-29 – 2022-11-30 (×2): qty 90, 90d supply, fill #1
  Filled 2023-02-28: qty 90, 90d supply, fill #2
  Filled 2023-05-28: qty 90, 90d supply, fill #3

## 2022-07-30 NOTE — Progress Notes (Signed)
Chief Complaint:  Timothy Sellers is a 42 y.o. male who presents today for his annual comprehensive physical exam.    Assessment/Plan:  Chronic Problems Addressed Today: Hypertension Uncontrolled.  Had lengthy discussion with patient today regarding importance of blood pressure control.  He has been out of his blood pressure medication for a few days.  We did discuss lifestyle modifications in depth including low-sodium diet and importance of regular cardiovascular exercise.  Will refill his losartan-HCTZ 100-25.  He will continue to monitor at home and let us know if persistently 140/90 or higher.  He had some leg edema with amlodipine in the past.  If blood pressures not at goal  would consider spironolactone.  We are also referring for sleep study as below.  Prediabetes Check A1c.  Discussed lifestyle modifications.  Sleep-disordered breathing Patient with significant daytime fatigue and somnolence.  Also snores at night.  May have underlying OSA which could be contributing to both his fatigue and elevated blood pressure readings.  Will refer for sleep study today.  Preventative Healthcare: Check labs.   Patient Counseling(The following topics were reviewed and/or handout was given):  -Nutrition: Stressed importance of moderation in sodium/caffeine intake, saturated fat and cholesterol, caloric balance, sufficient intake of fresh fruits, vegetables, and fiber.  -Stressed the importance of regular exercise.   -Substance Abuse: Discussed cessation/primary prevention of tobacco, alcohol, or other drug use; driving or other dangerous activities under the influence; availability of treatment for abuse.   -Injury prevention: Discussed safety belts, safety helmets, smoke detector, smoking near bedding or upholstery.   -Sexuality: Discussed sexually transmitted diseases, partner selection, use of condoms, avoidance of unintended pregnancy and contraceptive alternatives.   -Dental health:  Discussed importance of regular tooth brushing, flossing, and dental visits.  -Health maintenance and immunizations reviewed. Please refer to Health maintenance section.  Return to care in 1 year for next preventative visit.     Subjective:  HPI:  He has no acute complaints today.   Lifestyle Diet: Cutting down on salt and sodium.  Exercise: Cycling.      07/30/2022    1:17 PM  Depression screen PHQ 2/9  Decreased Interest 0  Down, Depressed, Hopeless 0  PHQ - 2 Score 0    Health Maintenance Due  Topic Date Due   COVID-19 Vaccine (1) Never done   Hepatitis C Screening  Never done   DTaP/Tdap/Td (2 - Td or Tdap) 02/04/2018     ROS: =Per HPI, otherwise a complete review of systems was negative.   PMH:  The following were reviewed and entered/updated in epic: Past Medical History:  Diagnosis Date   Hypertension    Patient Active Problem List   Diagnosis Date Noted   Sleep-disordered breathing 07/30/2022   Prediabetes 08/17/2019   Hypertension 09/19/2016   Right knee pain 03/21/2015   Past Surgical History:  Procedure Laterality Date   MOUTH SURGERY     None      Family History  Problem Relation Age of Onset   Hypertension Mother    Kidney failure Father    Diabetes Father    Asthma Sister    Kidney failure Maternal Grandmother    Stroke Maternal Grandfather     Medications- reviewed and updated Current Outpatient Medications  Medication Sig Dispense Refill   losartan-hydrochlorothiazide (HYZAAR) 100-25 MG tablet Take 1 tablet by mouth daily. *Need office visit* 90 tablet 3   No current facility-administered medications for this visit.    Allergies-reviewed and updated Allergies  Allergen Reactions   Food Swelling    WALNUTS   Hydrocodone-Acetaminophen Hives   Shellfish Allergy Hives    Social History   Socioeconomic History   Marital status: Married    Spouse name: Not on file   Number of children: 2   Years of education: Not on file    Highest education level: Not on file  Occupational History    Comment: Owner of a cleaning company  Tobacco Use   Smoking status: Never   Smokeless tobacco: Never  Vaping Use   Vaping Use: Never used  Substance and Sexual Activity   Alcohol use: Yes    Alcohol/week: 0.0 standard drinks of alcohol    Comment: socially   Drug use: No   Sexual activity: Yes  Other Topics Concern   Not on file  Social History Narrative   Not on file   Social Determinants of Health   Financial Resource Strain: Not on file  Food Insecurity: Not on file  Transportation Needs: Not on file  Physical Activity: Not on file  Stress: Not on file  Social Connections: Not on file        Objective:  Physical Exam: BP (!) 166/102   Pulse 87   Temp 97.7 F (36.5 C) (Temporal)   Wt 268 lb (121.6 kg)   SpO2 97%   BMI 35.36 kg/m   Body mass index is 35.36 kg/m. Wt Readings from Last 3 Encounters:  07/30/22 268 lb (121.6 kg)  06/15/21 266 lb (120.7 kg)  12/14/20 270 lb (122.5 kg)   Gen: NAD, resting comfortably HEENT: TMs normal bilaterally. OP clear. No thyromegaly noted.  CV: RRR with no murmurs appreciated Pulm: NWOB, CTAB with no crackles, wheezes, or rhonchi GI: Normal bowel sounds present. Soft, Nontender, Nondistended. MSK: no edema, cyanosis, or clubbing noted Skin: warm, dry Neuro: CN2-12 grossly intact. Strength 5/5 in upper and lower extremities. Reflexes symmetric and intact bilaterally.  Psych: Normal affect and thought content     Christalynn Boise M. Jimmey Ralph, MD 07/30/2022 2:17 PM

## 2022-07-30 NOTE — Assessment & Plan Note (Signed)
Check A1c.  Discussed lifestyle modifications. °

## 2022-07-30 NOTE — Patient Instructions (Signed)
It was very nice to see you today!  We will check blood work today.  I will refill your blood pressure medications.  Please continue to work on diet and exercise.  We will order a sleep study.  You may have sleep apnea.  This could cause your fatigue and high blood pressure readings.  Please keep an eye on your blood pressure and let us know if it is persistently elevated at home.  No follow-ups on file.   Take care, Dr Jimmey Ralph  PLEASE NOTE:  If you had any lab tests, please let us know if you have not heard back within a few days. You may see your results on mychart before we have a chance to review them but we will give you a call once they are reviewed by [place].   If we ordered any referrals today, please let us know if you have not heard from their office within the next week.   If you had any urgent prescriptions sent in today, please check with the pharmacy within an hour of our visit to make sure the prescription was transmitted appropriately.   Please try these tips to maintain a healthy lifestyle:  Eat at least 3 REAL meals and 1-2 snacks per day.  Aim for no more than 5 hours between eating.  If you eat breakfast, please do so within one hour of getting up.   Each meal should contain half fruits/vegetables, one quarter protein, and one quarter carbs (no bigger than a computer mouse)  Cut down on sweet beverages. This includes juice, soda, and sweet tea.   Drink at least 1 glass of water with each meal and aim for at least 8 glasses per day  Exercise at least 150 minutes every week.    Preventive Care 19-41 Years Old, Male Preventive care refers to lifestyle choices and visits with your health care provider that can promote health and wellness. Preventive care visits are also called wellness exams. What can I expect for my preventive care visit? Counseling During your preventive care visit, your health care provider may ask about your: Medical history, including: Past  medical problems. Family medical history. Current health, including: Emotional well-being. Home life and relationship well-being. Sexual activity. Lifestyle, including: Alcohol, nicotine or tobacco, and drug use. Access to firearms. Diet, exercise, and sleep habits. Safety issues such as seatbelt and bike helmet use. Sunscreen use. Work and work Astronomer. Physical exam Your health care provider may check your: Height and weight. These may be used to calculate your BMI (body mass index). BMI is a measurement that tells if you are at a healthy weight. Waist circumference. This measures the distance around your waistline. This measurement also tells if you are at a healthy weight and may help predict your risk of certain diseases, such as type 2 diabetes and high blood pressure. Heart rate and blood pressure. Body temperature. Skin for abnormal spots. What immunizations do I need?  Vaccines are usually given at various ages, according to a schedule. Your health care provider will recommend vaccines for you based on your age, medical history, and lifestyle or other factors, such as travel or where you work. What tests do I need? Screening Your health care provider may recommend screening tests for certain conditions. This may include: Lipid and cholesterol levels. Diabetes screening. This is done by checking your blood sugar (glucose) after you have not eaten for a while (fasting). Hepatitis B test. Hepatitis C test. HIV (human immunodeficiency virus) test.  STI (sexually transmitted infection) testing, if you are at risk. Talk with your health care provider about your test results, treatment options, and if necessary, the need for more tests. Follow these instructions at home: Eating and drinking  Eat a healthy diet that includes fresh fruits and vegetables, whole grains, lean protein, and low-fat dairy products. Drink enough fluid to keep your urine pale yellow. Take vitamin and  mineral supplements as recommended by your health care provider. Do not drink alcohol if your health care provider tells you not to drink. If you drink alcohol: Limit how much you have to 0-2 drinks a day. Know how much alcohol is in your drink. In the [place] one drink equals one 12 oz bottle of beer (355 mL), one 5 oz glass of wine (148 mL), or one 1 oz glass of hard liquor (44 mL). Lifestyle Brush your teeth every morning and night with fluoride toothpaste. Floss one time each day. Exercise for at least 30 minutes 5 or more days each week. Do not use any products that contain nicotine or tobacco. These products include cigarettes, chewing tobacco, and vaping devices, such as e-cigarettes. If you need help quitting, ask your health care provider. Do not use drugs. If you are sexually active, practice safe sex. Use a condom or other form of protection to prevent STIs. Find healthy ways to manage stress, such as: Meditation, yoga, or listening to music. Journaling. Talking to a trusted person. Spending time with friends and family. Minimize exposure to UV radiation to reduce your risk of skin cancer. Safety Always wear your seat belt while driving or riding in a vehicle. Do not drive: If you have been drinking alcohol. Do not ride with someone who has been drinking. If you have been using any mind-altering substances or drugs. While texting. When you are tired or distracted. Wear a helmet and other protective equipment during sports activities. If you have firearms in your house, make sure you follow all gun safety procedures. Seek help if you have been physically or sexually abused. What's next? Go to your health care provider once a year for an annual wellness visit. Ask your health care provider how often you should have your eyes and teeth checked. Stay up to date on all vaccines. This information is not intended to replace advice given to you by your health care provider. Make sure  you discuss any questions you have with your health care provider. Document Revised: 07/19/2020 Document Reviewed: 07/19/2020 Elsevier Patient Education  2024 [employer].

## 2022-07-30 NOTE — Assessment & Plan Note (Signed)
Uncontrolled.  Had lengthy discussion with patient today regarding importance of blood pressure control.  He has been out of his blood pressure medication for a few days.  We did discuss lifestyle modifications in depth including low-sodium diet and importance of regular cardiovascular exercise.  Will refill his losartan-HCTZ 100-25.  He will continue to monitor at home and let us know if persistently 140/90 or higher.  He had some leg edema with amlodipine in the past.  If blood pressures not at goal  would consider spironolactone.  We are also referring for sleep study as below.

## 2022-07-30 NOTE — Assessment & Plan Note (Signed)
Patient with significant daytime fatigue and somnolence.  Also snores at night.  May have underlying OSA which could be contributing to both his fatigue and elevated blood pressure readings.  Will refer for sleep study today.

## 2022-07-31 LAB — LIPID PANEL
Cholesterol: 141 mg/dL (ref 0–200)
HDL: 45.8 mg/dL (ref >39.00)
LDL Cholesterol: 75 mg/dL (ref 0–99)
NonHDL: 95.31
Total CHOL/HDL Ratio: 3
Triglycerides: 103 mg/dL (ref 0.0–149.0)
VLDL: 20.6 mg/dL (ref 0.0–40.0)

## 2022-07-31 LAB — CBC
HCT: 43.7 % (ref 39.0–52.0)
Hemoglobin: 14.3 g/dL (ref 13.0–17.0)
MCHC: 32.7 g/dL (ref 30.0–36.0)
MCV: 88 fl (ref 78.0–100.0)
Platelets: 285 10*3/uL (ref 150.0–400.0)
RBC: 4.97 Mil/uL (ref 4.22–5.81)
RDW: 13.8 % (ref 11.5–15.5)
WBC: 9.2 10*3/uL (ref 4.0–10.5)

## 2022-07-31 LAB — COMPREHENSIVE METABOLIC PANEL
ALT: 15 U/L (ref 0–53)
AST: 22 U/L (ref 0–37)
Albumin: 4.3 g/dL (ref 3.5–5.2)
Alkaline Phosphatase: 54 U/L (ref 39–117)
BUN: 16 mg/dL (ref 6–23)
CO2: 28 mEq/L (ref 19–32)
Calcium: 9.7 mg/dL (ref 8.4–10.5)
Chloride: 100 mEq/L (ref 96–112)
Creatinine, Ser: 1.11 mg/dL (ref 0.40–1.50)
GFR: 83.5 mL/min (ref >60.00)
Glucose, Bld: 85 mg/dL (ref 70–99)
Potassium: 3.8 mEq/L (ref 3.5–5.1)
Sodium: 136 mEq/L (ref 135–145)
Total Bilirubin: 0.4 mg/dL (ref 0.2–1.2)
Total Protein: 7 g/dL (ref 6.0–8.3)

## 2022-07-31 LAB — TSH: TSH: 1.27 u[IU]/mL (ref 0.35–5.50)

## 2022-07-31 LAB — HEMOGLOBIN A1C: Hgb A1c MFr Bld: 5.7 % (ref 4.6–6.5)

## 2022-08-01 NOTE — Progress Notes (Signed)
His labs are all stable.  Blood sugars in the borderline range but better than where it was last year.  Cholesterol levels are at goal.  He should continue the good work with diet and exercise and we can recheck everything in a year.

## 2022-08-12 ENCOUNTER — Other Ambulatory Visit (HOSPITAL_COMMUNITY): Payer: Self-pay

## 2022-08-28 ENCOUNTER — Other Ambulatory Visit (HOSPITAL_BASED_OUTPATIENT_CLINIC_OR_DEPARTMENT_OTHER): Payer: Self-pay

## 2022-11-29 ENCOUNTER — Other Ambulatory Visit (HOSPITAL_COMMUNITY): Payer: Self-pay

## 2022-11-30 ENCOUNTER — Other Ambulatory Visit (HOSPITAL_COMMUNITY): Payer: Self-pay

## 2022-12-02 ENCOUNTER — Other Ambulatory Visit (HOSPITAL_COMMUNITY): Payer: Self-pay

## 2022-12-12 ENCOUNTER — Other Ambulatory Visit (HOSPITAL_COMMUNITY): Payer: Self-pay

## 2023-02-28 ENCOUNTER — Other Ambulatory Visit (HOSPITAL_COMMUNITY): Payer: Self-pay

## 2023-02-28 ENCOUNTER — Other Ambulatory Visit: Payer: Self-pay

## 2023-03-30 ENCOUNTER — Other Ambulatory Visit: Payer: Self-pay

## 2023-03-30 ENCOUNTER — Ambulatory Visit
Admission: EM | Admit: 2023-03-30 | Discharge: 2023-03-30 | Disposition: A | Discharge status: Home / Self Care | Payer: Commercial Managed Care - PPO | Attending: Family Medicine | Admitting: Family Medicine

## 2023-03-30 DIAGNOSIS — J069 Acute upper respiratory infection, unspecified: Secondary | ICD-10-CM | POA: Diagnosis not present

## 2023-03-30 LAB — POC COVID19/FLU A&B COMBO
Covid Antigen, POC: NEGATIVE
Influenza A Antigen, POC: NEGATIVE
Influenza B Antigen, POC: NEGATIVE

## 2023-03-30 NOTE — Discharge Instructions (Signed)
 Your blood pressure is elevated today Take your blood pressure medications as prescribed Monitor your blood pressure at home and keep a daily log To your primary care provider as soon as possible for blood pressure check Flonase and nasal saline spray for sinus congestion

## 2023-03-30 NOTE — ED Provider Notes (Signed)
 [facility]    CSN: [id number] Arrival date & time: 03/30/23  4098      History   Chief Complaint Chief Complaint  Patient presents with   Sinus Pressure    HPI Timothy Sellers is a 43 y.o. male.   HPI Woke today with right-sided sinus pressure, nasal congestion.  States his ears feel full.  Admits slight rhinorrhea.  Had a cough 2 days ago that lasted 1 day.  His wife had the flu recently.  Had chest soreness when he coughed resolved. Denies fever, chills, body aches, fatigue, sore throat, swollen glands, shortness of breath. Last medical history hypertension, did not take his blood pressure medicines today  Past Medical History:  Diagnosis Date   Hypertension     Patient Active Problem List   Diagnosis Date Noted   Sleep-disordered breathing 07/30/2022   Prediabetes 08/17/2019   Hypertension 09/19/2016   Right knee pain 03/21/2015    Past Surgical History:  Procedure Laterality Date   MOUTH SURGERY     None         Home Medications    Prior to Admission medications   Medication Sig Start Date End Date Taking? Authorizing Provider  losartan (COZAAR) 100 MG tablet Take 100 mg by mouth. 02/01/18  Yes [provider]  losartan-hydrochlorothiazide (HYZAAR) 100-25 MG tablet Take 1 tablet by mouth daily. *Need office visit* 07/30/22   Ardith Dark, MD    Family History Family History  Problem Relation Age of Onset   Hypertension Mother    Kidney failure Father    Diabetes Father    Asthma Sister    Kidney failure Maternal Grandmother    Stroke Maternal Grandfather     Social History Social History   Tobacco Use   Smoking status: Never   Smokeless tobacco: Never  Vaping Use   Vaping status: Never Used  Substance Use Topics   Alcohol use: Yes    Alcohol/week: 0.0 standard drinks of alcohol    Comment: socially   Drug use: No     Allergies   Food, Hydrocodone-acetaminophen, and Shellfish allergy   Review of  Systems Review of Systems   Physical Exam Triage Vital Signs ED Triage Vitals  Encounter Vitals Group     BP 03/30/23 0909 (!) 177/106     Systolic BP Percentile --      Diastolic BP Percentile --      Pulse Rate 03/30/23 0909 73     Resp 03/30/23 0909 18     Temp 03/30/23 0909 98 F (36.7 C)     Temp Source 03/30/23 0909 Oral     SpO2 03/30/23 0909 96 %     Weight --      Height 03/30/23 0908 6\' 1"  (1.854 m)     Head Circumference --      Peak Flow --      Pain Score 03/30/23 0908 7     Pain Loc --      Pain Education --      Exclude from Growth Chart --    No data found.  Updated Vital Signs BP (!) 177/106 (BP Location: Right Arm) Comment: pt reports he has not taken his BP medications today.  Pulse 73   Temp 98 F (36.7 C) (Oral)   Resp 18   Ht 6\' 1"  (1.854 m)   SpO2 96%   BMI 35.36 kg/m   Visual Acuity Right Eye Distance:   Left Eye Distance:  Bilateral Distance:    Right Eye Near:   Left Eye Near:    Bilateral Near:     Physical Exam Vitals and nursing note reviewed.  Constitutional:      Appearance: He is not ill-appearing or toxic-appearing.  HENT:     Head: Normocephalic and atraumatic.     Right Ear: Tympanic membrane and ear canal normal.     Left Ear: Tympanic membrane and ear canal normal.     Nose: Congestion present. No rhinorrhea.     Right Sinus: Maxillary sinus tenderness present. No frontal sinus tenderness.     Left Sinus: No maxillary sinus tenderness or frontal sinus tenderness.     Mouth/Throat:     Mouth: Mucous membranes are moist.     Pharynx: Oropharynx is clear. No oropharyngeal exudate or posterior oropharyngeal erythema.  Eyes:     Conjunctiva/sclera: Conjunctivae normal.  Cardiovascular:     Rate and Rhythm: Normal rate and regular rhythm.     Heart sounds: Normal heart sounds.  Pulmonary:     Effort: Pulmonary effort is normal.     Breath sounds: Normal breath sounds.  Musculoskeletal:     Cervical back: Neck  supple.  Neurological:     Mental Status: He is alert.      UC Treatments / Results  Labs (all labs ordered are listed, but only abnormal results are displayed) Labs Reviewed - No data to display  EKG   Radiology No results found.  Procedures Procedures (including critical care time)  Medications Ordered in UC Medications - No data to display  Initial Impression / Assessment and Plan / UC Course  I have reviewed the triage vital signs and the nursing notes.  Pertinent labs & imaging results that were available during my care of the patient were reviewed by me and considered in my medical decision making (see chart for details).     43 year old male with sinus congestion, ear pressure, cough.  Recent exposure to flu.  He is hypertensive but otherwise well-appearing, no evidence of bacterial infection on exam, point-of-care COVID and flu are negative recommend Flonase and saline rinses for nasal congestion.  Patient counseled to take his blood pressure medications, he was given a log sheet so he can monitor his blood pressures at home and he was counseled to see his PCP as soon as possible for blood pressure check, go to ED if development of any symptoms such as headache, chest pain, shortness of breath Final Clinical Impressions(s) / UC Diagnoses   Final diagnoses:  None   Discharge Instructions   None    ED Prescriptions   None    PDMP not reviewed this encounter.   Meliton Rattan, [place] 03/30/23 [phone]

## 2023-03-30 NOTE — ED Triage Notes (Addendum)
 Pt presents to urgent care with dry cough x 1 day. Pt states he woke up this morning and felt a severe amount of pressure in face/head. Pt currently rates his overall pain a 7/10. OTC Tylenol and cough syrup taken last night with temporary relief. Denies fevers at home. Exposed to flu last week by family member in same household.

## 2023-07-30 ENCOUNTER — Telehealth: Payer: Self-pay | Admitting: Family Medicine

## 2023-07-30 MED ORDER — LOSARTAN POTASSIUM-HCTZ 100-25 MG PO TABS: 1.0000 | ORAL_TABLET | Freq: Every day | ORAL | 0 refills | Status: DC

## 2023-07-30 NOTE — Telephone Encounter (Signed)
 Copied from CRM [id number]. Topic: Clinical - Medication Refill >> Jul 30, 2023  2:55 PM Henretta I wrote: Medication: losartan -hydrochlorothiazide  (HYZAAR ) 100-25 MG tablet  Has the patient contacted their pharmacy? No (Agent: If no, request that the patient contact the pharmacy for the refill. If patient does not wish to contact the pharmacy document the reason why and proceed with request.) (Agent: If yes, when and what did the pharmacy advise?)  This is the patient's preferred pharmacy:  [facility]  [address] [place]  [address] Phone: [phone] Is this the correct pharmacy for this prescription? Yes If no, delete pharmacy and type the correct one.   Has the prescription been filled recently? No  Is the patient out of the medication? No ,Patient left medication in [place] and is currently in [place]  needs a bridge refill as he is boarding ship tomorrow   Has the patient been seen for an appointment in the last year OR does the patient have an upcoming appointment? Yes  Can we respond through MyChart? Yes  Agent: Please be advised that Rx refills may take up to 3 business days. We ask that you follow-up with your pharmacy.

## 2023-08-29 ENCOUNTER — Encounter: Payer: Self-pay | Admitting: Family Medicine

## 2023-08-29 ENCOUNTER — Other Ambulatory Visit (HOSPITAL_COMMUNITY): Payer: Self-pay

## 2023-08-29 ENCOUNTER — Telehealth: Payer: Self-pay

## 2023-08-29 ENCOUNTER — Other Ambulatory Visit: Payer: Self-pay

## 2023-08-29 MED ORDER — LOSARTAN POTASSIUM-HCTZ 100-25 MG PO TABS
1.0000 | ORAL_TABLET | Freq: Every day | ORAL | 0 refills | Status: DC
  Filled 2023-08-29: qty 30, 30d supply, fill #0

## 2023-08-29 NOTE — Telephone Encounter (Signed)
 Copied from CRM [id number]. Topic: Clinical - Medication Question >> Aug 29, 2023  3:04 PM Chasity T wrote: Reason for CRM: patient is calling in to see if he can get a temp refill for the losartan -hydrochlorothiazide  (HYZAAR ) 100-25 MG until his appointment since he is completely out of the medication.  Pt has appt coming up sent in 30 day supply no refill.

## 2023-08-30 ENCOUNTER — Other Ambulatory Visit (HOSPITAL_COMMUNITY): Payer: Self-pay

## 2023-09-09 ENCOUNTER — Ambulatory Visit: Admitting: Family Medicine

## 2023-09-09 ENCOUNTER — Encounter: Payer: Self-pay | Admitting: Family Medicine

## 2023-09-09 VITALS — BP 162/116 | HR 71 | Temp 97.2°F | Ht 73.0 in | Wt 264.0 lb

## 2023-09-09 DIAGNOSIS — I10 Essential (primary) hypertension: Secondary | ICD-10-CM

## 2023-09-09 DIAGNOSIS — G4730 Sleep apnea, unspecified: Secondary | ICD-10-CM

## 2023-09-09 MED ORDER — LOSARTAN POTASSIUM-HCTZ 100-25 MG PO TABS
1.0000 | ORAL_TABLET | Freq: Every day | ORAL | 3 refills | Status: DC
  Filled 2023-12-05: qty 30, 30d supply, fill #0
  Filled 2024-01-02: qty 30, 30d supply, fill #1
  Filled 2024-02-09 (×2): qty 30, 30d supply, fill #2

## 2023-09-09 MED ORDER — SPIRONOLACTONE 25 MG PO TABS: 25.0000 mg | ORAL_TABLET | Freq: Every day | ORAL | 3 refills | Status: DC

## 2023-09-09 NOTE — Patient Instructions (Signed)
 It was very nice to see you today!  VISIT SUMMARY:  During your visit, we discussed your concerns about managing your blood pressure, your sleep patterns, and the sensation of fullness in your ears. We reviewed your current medications and lifestyle habits to create a plan to help you achieve better health outcomes.  YOUR PLAN:  -HYPERTENSION: Hypertension means high blood pressure, which can lead to serious health problems if not managed properly. Your blood pressure readings have been higher than the target, and you have occasionally missed doses of your medication. We have prescribed spironolactone  25 mg daily to be taken in the morning along with your current medication, and refilled your losartan  HCTZ. Please monitor your blood pressure at home and report the readings in a few weeks. Additionally, try to reduce your sodium intake and aim for 30 minutes of cardio exercise daily.  -EUSTACHIAN TUBE DYSFUNCTION WITH MIDDLE EAR EFFUSION: This condition involves fluid buildup behind the ear, causing a sensation of fullness and popping. We recommend using Flonase nasal spray to help open your Eustachian tubes and taking an allergy medication like Allegra, Zyrtec, or Claritin without decongestants.  -SLEEP DEPRIVATION: Sleep deprivation means not getting enough sleep, which can affect your overall health and contribute to high blood pressure. You are currently getting about 5 hours of sleep per night, which is insufficient. We discussed the potential impact of sleep apnea on your blood pressure and the benefits of a sleep study to evaluate this further.  INSTRUCTIONS:  Please monitor your blood pressure at home and report the readings in a few weeks. Consider reducing your sodium intake and aim for 30 minutes of cardio exercise daily. Use Flonase nasal spray and take an allergy medication like Allegra, Zyrtec, or Claritin without decongestants for your ear symptoms. If your sleep issues persist,  consider scheduling a sleep study to evaluate for sleep apnea.  Return for Annual Physical.   Take care, Dr Kennyth  PLEASE NOTE:  If you had any lab tests, please let us  know if you have not heard back within a few days. You may see your results on mychart before we have a chance to review them but we will give you a call once they are reviewed by us .   If we ordered any referrals today, please let us  know if you have not heard from their office within the next week.   If you had any urgent prescriptions sent in today, please check with the pharmacy within an hour of our visit to make sure the prescription was transmitted appropriately.   Please try these tips to maintain a healthy lifestyle:  Eat at least 3 REAL meals and 1-2 snacks per day.  Aim for no more than 5 hours between eating.  If you eat breakfast, please do so within one hour of getting up.   Each meal should contain half fruits/vegetables, one quarter protein, and one quarter carbs (no bigger than a computer mouse)  Cut down on sweet beverages. This includes juice, soda, and sweet tea.   Drink at least 1 glass of water with each meal and aim for at least 8 glasses per day  Exercise at least 150 minutes every week.

## 2023-09-09 NOTE — Assessment & Plan Note (Signed)
 Will place referral for sleep study.  Likely contributing to his above uncontrolled hypertension.

## 2023-09-09 NOTE — Progress Notes (Signed)
 Timothy Sellers is a 43 y.o. male who presents today for an office visit.  Assessment/Plan:  New/Acute Problems: Eustachian tube dysfunction with middle ear effusion   Fluid buildup behind the right ear is likely due to Eustachian tube dysfunction, causing fullness and popping sensations. Recommend Flonase nasal spray to open Eustachian tubes. Advise taking an allergy medication such as Allegra, Zyrtec, or Claritin without decongestants.  Sleep deprivation   He sleeps approximately 5 hours per night, which is insufficient and may contribute to elevated blood pressure. Snoring has decreased with weight loss, but the potential impact of sleep apnea on hypertension is discussed. Consider the benefits of a sleep study to evaluate for sleep apnea.   Chronic Problems Addressed Today: Hypertension Blood pressure uncontrolled today.  Had lengthy discussion regarding management of blood pressure and importance of good blood pressure control.  We will refill his losartan /HCTZ 100-25 today.  We did discussion an additional medication at this point.  He is agreeable to this.  Will start spironolactone  25 mg daily.  He will follow-up with us  in a few weeks.  Recheck labs at that time.  Deferred checking labs today.    Sleep-disordered breathing Will place referral for sleep study.  Likely contributing to his above uncontrolled hypertension.     Subjective:  HPI:  See assessment / plan for status of chronic conditions. Patient here today for medication refill.   Discussed the use of AI scribe software for clinical note transcription with the patient, who gave verbal consent to proceed.  History of Present Illness Timothy Sellers is a 43 year old male with hypertension who presents with concerns about blood pressure management.  He has been inconsistent with his blood pressure medication, sometimes forgetting to take it at night and taking it in the morning instead. He was completely  out of his medication for about a day but has since resumed taking it. He is unsure how often he misses doses. He has not been checking his blood pressure at home recently but recalls it usually being around 160 mmHg. No chest pain, palpitations, or shortness of breath despite his high blood pressure.  He previously tried amlodipine , which caused leg swelling and epistaxis, and has since stopped drinking alcohol. He has a family history of hypertension, as his father also had high blood pressure.  He reports poor sleep, typically getting around five hours per night, as he goes to bed around 12:30 or 1 AM and wakes up at 6 AM. 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 He recalls a previous sleep study was scheduled but does not remember the details. He notes that his snoring has decreased and he has lost some weight.  He mentions experiencing a 'water logged ear' sensation since being on a boat, primarily affecting his right ear but also present in both ears. He describes a popping sensation and pressure when biting down, along with sinus congestion and drainage.         Objective:  Physical Exam: BP (!) 162/116   Pulse 71   Temp (!) 97.2 F (36.2 C) (Temporal)   Ht 6' 1 (1.854 m)   Wt 264 lb (119.7 kg)   SpO2 99%   BMI 34.83 kg/m   Wt Readings from Last 3 Encounters:  09/09/23 264 lb (119.7 kg)  07/30/22 268 lb (121.6 kg)  06/15/21 266 lb (120.7  kg)    Gen: No acute distress, resting comfortably HEENT: Left TM with clear effusion.  Right TM with clear effusion. CV: Regular rate and rhythm with no murmurs appreciated Pulm: Normal work of breathing, clear to auscultation bilaterally with no crackles, wheezes, or rhonchi Neuro: Grossly normal, moves all extremities Psych: Normal affect and thought content      Timothy Sellers M. Kennyth, MD 09/09/2023 10:55 AM

## 2023-09-09 NOTE — Assessment & Plan Note (Signed)
 Blood pressure uncontrolled today.  Had lengthy discussion regarding management of blood pressure and importance of good blood pressure control.  We will refill his losartan /HCTZ 100-25 today.  We did discussion an additional medication at this point.  He is agreeable to this.  Will start spironolactone  25 mg daily.  He will follow-up with us  in a few weeks.  Recheck labs at that time.  Deferred checking labs today.

## 2023-11-27 ENCOUNTER — Encounter: Admitting: Family Medicine

## 2023-12-05 ENCOUNTER — Other Ambulatory Visit (HOSPITAL_COMMUNITY): Payer: Self-pay

## 2023-12-05 ENCOUNTER — Other Ambulatory Visit (HOSPITAL_BASED_OUTPATIENT_CLINIC_OR_DEPARTMENT_OTHER): Payer: Self-pay

## 2024-01-02 ENCOUNTER — Ambulatory Visit
Admission: RE | Admit: 2024-01-02 | Discharge: 2024-01-02 | Disposition: A | Discharge status: Home / Self Care | Attending: Physician Assistant | Admitting: Physician Assistant

## 2024-01-02 ENCOUNTER — Other Ambulatory Visit (HOSPITAL_BASED_OUTPATIENT_CLINIC_OR_DEPARTMENT_OTHER): Payer: Self-pay

## 2024-01-02 ENCOUNTER — Other Ambulatory Visit: Payer: Self-pay

## 2024-01-02 VITALS — BP 173/122 | HR 94 | Temp 98.9°F | Resp 15 | Ht 73.0 in | Wt 250.0 lb

## 2024-01-02 DIAGNOSIS — M25562 Pain in left knee: Secondary | ICD-10-CM

## 2024-01-02 DIAGNOSIS — I10 Essential (primary) hypertension: Secondary | ICD-10-CM

## 2024-01-02 MED ORDER — MELOXICAM 15 MG PO TABS
15.0000 mg | ORAL_TABLET | Freq: Every day | ORAL | 0 refills | Status: AC
  Filled 2024-01-02: qty 30, 30d supply, fill #0

## 2024-01-02 NOTE — ED Triage Notes (Signed)
 Pt presents with a chief complaint of left knee pain. States he participated in a turkey trot yesterday morning. Was running, pushing daughter in stroller. Began to have pain during/after the run. Very sore and tender. Little pain sitting still however when standing/straightening out the left leg, pain increases greatly. Took OTC Ibuprofen  + Tylenol. Applied ice/heat & elevated left extremity, no improvement.

## 2024-01-02 NOTE — ED Provider Notes (Signed)
 [facility]    CSN: [id number] Arrival date & time: 01/02/24  1548      History   Chief Complaint Chief Complaint  Patient presents with   Knee Pain    HPI Timothy Sellers is a 43 y.o. male.  has a past medical history of Hypertension.   HPI  Discussed the use of AI scribe software for clinical note transcription with the patient, who gave verbal consent to proceed.  The patient presents with left knee pain following a recent injury.  The patient experienced left knee pain after participating in a turkey run while pushing his daughter's stroller. The incident involved an abrupt stop on his toe, leading to severe pain initially rated as 10 out of 10, which has since decreased to 4 or 5 out of 10 when standing. Swelling is present in the knee, but there is no bruising. He had difficulty bending the knee and supporting weight on it last night, though mobility has improved since then. The knee remains tender, and there is pain when attempting to straighten it. There is no prior history of injuries to this knee, although he has had significant injuries to the other knee in the past.  For pain management, he took Tylenol yesterday morning.  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 He has a history of high blood pressure, which is managed with medication taken this morning. He acknowledges consuming a high-salt meal recently, which may have affected his blood pressure readings.   Past Medical History:  Diagnosis Date   Hypertension     Patient Active Problem List   Diagnosis Date Noted   Sleep-disordered breathing 07/30/2022   Prediabetes 08/17/2019   Hypertension 09/19/2016   Right knee pain 03/21/2015    Past Surgical History:  Procedure Laterality Date   MOUTH SURGERY     None         Home Medications    Prior to Admission medications   Medication Sig Start Date End Date Taking? Authorizing  Provider  meloxicam  (MOBIC ) 15 MG tablet Take 1 tablet (15 mg total) by mouth daily. 01/02/24  Yes Holden Draughon E, PA-C  losartan -hydrochlorothiazide  (HYZAAR ) 100-25 MG tablet Take 1 tablet by mouth daily. *Need office visit* 09/09/23   Kennyth Worth HERO, MD  spironolactone  (ALDACTONE ) 25 MG tablet Take 1 tablet (25 mg total) by mouth daily. 09/09/23   Kennyth Worth HERO, MD    Family History Family History  Problem Relation Age of Onset   Hypertension Mother    Kidney failure Father    Diabetes Father    Asthma Sister    Kidney failure Maternal Grandmother    Stroke Maternal Grandfather     Social History Social History   Tobacco Use   Smoking status: Never   Smokeless tobacco: Never  Vaping Use   Vaping status: Never Used  Substance Use Topics   Alcohol use: Yes    Alcohol/week: 0.0 standard drinks of alcohol    Comment: socially   Drug use: No     Allergies   Food, Hydrocodone-acetaminophen, and Shellfish allergy   Review of Systems Review of Systems  Musculoskeletal:  Positive for arthralgias (left knee pain and swelling), joint swelling and myalgias.     Physical Exam Triage Vital Signs ED Triage Vitals  Encounter Vitals Group     BP 01/02/24 1606 (!) 187/120     Girls Systolic BP Percentile --  Girls Diastolic BP Percentile --      Boys Systolic BP Percentile --      Boys Diastolic BP Percentile --      Pulse Rate 01/02/24 1606 94     Resp 01/02/24 1606 15     Temp 01/02/24 1606 98.9 F (37.2 C)     Temp Source 01/02/24 1606 Oral     SpO2 01/02/24 1606 96 %     Weight 01/02/24 1606 250 lb (113.4 kg)     Height 01/02/24 1606 6' 1 (1.854 m)     Head Circumference --      Peak Flow --      Pain Score 01/02/24 1635 3     Pain Loc --      Pain Education --      Exclude from Growth Chart --    No data found.  Updated Vital Signs BP (!) 173/122 (BP Location: Right Arm)   Pulse 94   Temp 98.9 F (37.2 C) (Oral)   Resp 15   Ht 6' 1 (1.854 m)   Wt  250 lb (113.4 kg)   SpO2 96%   BMI 32.98 kg/m   Visual Acuity Right Eye Distance:   Left Eye Distance:   Bilateral Distance:    Right Eye Near:   Left Eye Near:    Bilateral Near:     Physical Exam Vitals reviewed.  Constitutional:      General: He is awake.     Appearance: Normal appearance. He is well-developed and well-groomed.  HENT:     Head: Normocephalic and atraumatic.  Eyes:     Extraocular Movements: Extraocular movements intact.     Conjunctiva/sclera: Conjunctivae normal.  Pulmonary:     Effort: Pulmonary effort is normal.  Musculoskeletal:     Cervical back: Normal range of motion.     Left knee: Swelling and effusion (mild swelling appreciated around the joint line) present. No deformity, erythema or ecchymosis. Normal range of motion. Tenderness present. No LCL laxity, MCL laxity, ACL laxity or PCL laxity.Normal alignment.     Instability Tests: Anterior drawer test negative. Posterior drawer test negative. Medial McMurray test negative and lateral McMurray test negative.     Comments: Pt has tenderness with Apley Grind testing on the left knee.  No appreciated laxity or crepitus   Neurological:     Mental Status: He is alert and oriented to person, place, and time.  Psychiatric:        Attention and Perception: Attention normal.        Mood and Affect: Mood normal.        Speech: Speech normal.        Behavior: Behavior normal. Behavior is cooperative.      UC Treatments / Results  Labs (all labs ordered are listed, but only abnormal results are displayed) Labs Reviewed - No data to display  EKG   Radiology No results found.  Procedures Procedures (including critical care time)  Medications Ordered in UC Medications - No data to display  Initial Impression / Assessment and Plan / UC Course  I have reviewed the triage vital signs and the nursing notes.  Pertinent labs & imaging results that were available during my care of the patient were  reviewed by me and considered in my medical decision making (see chart for details).      Final Clinical Impressions(s) / UC Diagnoses   Final diagnoses:  Left anterior knee pain  Hypertension, unspecified type  Left knee injury Acute left knee injury sustained during a turkey run while pushing a stroller. Pain initially rated as 10/10, now improved to 4-5/10. Swelling present, no bruising. Possible meniscus injury or bursa damage causing fluid leakage and pressure. No instability in ACL, PCL, or ligaments.Reviewed that MRI needed for meniscus or soft tissue injury. Progressive improvement suggests conservative management is appropriate for now with monitoring. - Prescribed meloxicam once daily for inflammation. - Advised use of Tylenol as needed for pain. - Provided a hinge knee brace for stability. - Recommended gentle stretches. - Instructed to follow up with orthopedics if swelling persists, pain remains significant, or if there is difficulty with daily activities.  Hypertension Elevated blood pressure noted during the visit. He reports taking medication this morning but consumed a high-salt meal. Blood pressure tends to be higher in the office, possibly due to white coat hypertension. Home readings are reportedly lower. - Advised monitoring blood pressure at home and recording readings. - Recommended follow-up with primary care provider in two weeks, especially if blood pressure remains elevated.    Discharge Instructions      VISIT SUMMARY:  You visited us  today due to left knee pain following a recent injury during a turkey run while pushing your daughter's stroller. You experienced severe pain initially, which has since decreased, but you still have swelling and tenderness in the knee. We also discussed your high blood pressure, which you are managing with medication.  YOUR PLAN:  -LEFT KNEE INJURY: You have an acute injury to your left knee, possibly involving the  meniscus or bursa, which is causing pain and swelling. We have prescribed meloxicam to reduce inflammation and advised you to use Tylenol for pain relief as needed. You should wear the hinge knee brace we provided for stability and perform gentle stretches. If the swelling persists, the pain remains significant, or you have difficulty with daily activities, please follow up with orthopedics.  -HYPERTENSION: Your blood pressure was elevated during the visit, which may be influenced by a recent high-salt meal and the stress of the visit. We recommend that you monitor your blood pressure at home and record the readings. Please follow up with your primary care provider in two weeks, especially if your blood pressure remains elevated.  INSTRUCTIONS:  Please follow up with orthopedics if your knee swelling persists, the pain remains significant, or you have difficulty with daily activities. Additionally, follow up with your primary care provider in two weeks to review your blood pressure readings, especially if they remain elevated.     ED Prescriptions     Medication Sig Dispense Auth. Provider   meloxicam (MOBIC) 15 MG tablet Take 1 tablet (15 mg total) by mouth daily. 30 tablet Thomasenia Dowse E, PA-C      PDMP not reviewed this encounter.   Marylene Rocky BRAVO, PA-C 01/02/24 1731

## 2024-01-02 NOTE — Discharge Instructions (Addendum)
 VISIT SUMMARY:  You visited us  today due to left knee pain following a recent injury during a turkey run while pushing your daughter's stroller. You experienced severe pain initially, which has since decreased, but you still have swelling and tenderness in the knee. We also discussed your high blood pressure, which you are managing with medication.  YOUR PLAN:  -LEFT KNEE INJURY: You have an acute injury to your left knee, possibly involving the meniscus or bursa, which is causing pain and swelling. We have prescribed meloxicam  to reduce inflammation and advised you to use Tylenol for pain relief as needed. You should wear the hinge knee brace we provided for stability and perform gentle stretches. If the swelling persists, the pain remains significant, or you have difficulty with daily activities, please follow up with orthopedics.  -HYPERTENSION: Your blood pressure was elevated during the visit, which may be influenced by a recent high-salt meal and the stress of the visit. We recommend that you monitor your blood pressure at home and record the readings. Please follow up with your primary care provider in two weeks, especially if your blood pressure remains elevated.  INSTRUCTIONS:  Please follow up with orthopedics if your knee swelling persists, the pain remains significant, or you have difficulty with daily activities. Additionally, follow up with your primary care provider in two weeks to review your blood pressure readings, especially if they remain elevated.

## 2024-02-09 ENCOUNTER — Other Ambulatory Visit (HOSPITAL_BASED_OUTPATIENT_CLINIC_OR_DEPARTMENT_OTHER): Payer: Self-pay

## 2024-02-10 ENCOUNTER — Other Ambulatory Visit: Payer: Self-pay

## 2024-02-19 ENCOUNTER — Ambulatory Visit (INDEPENDENT_AMBULATORY_CARE_PROVIDER_SITE_OTHER): Admitting: Family Medicine

## 2024-02-19 ENCOUNTER — Other Ambulatory Visit (HOSPITAL_BASED_OUTPATIENT_CLINIC_OR_DEPARTMENT_OTHER): Payer: Self-pay

## 2024-02-19 DIAGNOSIS — R7303 Prediabetes: Secondary | ICD-10-CM | POA: Diagnosis not present

## 2024-02-19 DIAGNOSIS — Z13220 Encounter for screening for lipoid disorders: Secondary | ICD-10-CM

## 2024-02-19 DIAGNOSIS — I10 Essential (primary) hypertension: Secondary | ICD-10-CM | POA: Diagnosis not present

## 2024-02-19 DIAGNOSIS — Z0001 Encounter for general adult medical examination with abnormal findings: Secondary | ICD-10-CM | POA: Diagnosis not present

## 2024-02-19 LAB — COMPREHENSIVE METABOLIC PANEL WITH GFR
ALT: 17 U/L (ref 3–53)
AST: 27 U/L (ref 5–37)
Albumin: 4.6 g/dL (ref 3.5–5.2)
Alkaline Phosphatase: 57 U/L (ref 39–117)
BUN: 14 mg/dL (ref 6–23)
CO2: 32 meq/L (ref 19–32)
Calcium: 9.7 mg/dL (ref 8.4–10.5)
Chloride: 101 meq/L (ref 96–112)
Creatinine, Ser: 1.05 mg/dL (ref 0.40–1.50)
GFR: 88.29 mL/min
Glucose, Bld: 83 mg/dL (ref 70–99)
Potassium: 4.2 meq/L (ref 3.5–5.1)
Sodium: 138 meq/L (ref 135–145)
Total Bilirubin: 0.7 mg/dL (ref 0.2–1.2)
Total Protein: 7.3 g/dL (ref 6.0–8.3)

## 2024-02-19 LAB — LIPID PANEL
Cholesterol: 163 mg/dL (ref 28–200)
HDL: 55.6 mg/dL
LDL Cholesterol: 93 mg/dL (ref 10–99)
NonHDL: 107.37
Total CHOL/HDL Ratio: 3
Triglycerides: 73 mg/dL (ref 10.0–149.0)
VLDL: 14.6 mg/dL (ref 0.0–40.0)

## 2024-02-19 LAB — HEMOGLOBIN A1C: Hgb A1c MFr Bld: 5.8 % (ref 4.6–6.5)

## 2024-02-19 LAB — CBC
HCT: 44.7 % (ref 39.0–52.0)
Hemoglobin: 15 g/dL (ref 13.0–17.0)
MCHC: 33.5 g/dL (ref 30.0–36.0)
MCV: 86.6 fl (ref 78.0–100.0)
Platelets: 274 K/uL (ref 150.0–400.0)
RBC: 5.16 Mil/uL (ref 4.22–5.81)
RDW: 13.8 % (ref 11.5–15.5)
WBC: 7.9 K/uL (ref 4.0–10.5)

## 2024-02-19 LAB — TSH: TSH: 1.18 u[IU]/mL (ref 0.35–5.50)

## 2024-02-19 MED ORDER — METOPROLOL SUCCINATE ER 50 MG PO TB24
50.0000 mg | ORAL_TABLET | Freq: Every day | ORAL | 3 refills | Status: AC
  Filled 2024-02-19: qty 90, 90d supply, fill #0

## 2024-02-19 MED ORDER — LOSARTAN POTASSIUM-HCTZ 100-25 MG PO TABS
1.0000 | ORAL_TABLET | Freq: Every day | ORAL | 3 refills | Status: AC
  Filled 2024-02-19: qty 90, 90d supply, fill #0
  Filled 2024-03-05: qty 30, 30d supply, fill #0
  Filled 2024-03-05 – 2024-03-08 (×2): qty 90, 90d supply, fill #0

## 2024-02-19 NOTE — Progress Notes (Signed)
 "  Chief Complaint:  Timothy Sellers is a 44 y.o. male who presents today for his annual comprehensive physical exam.    Assessment/Plan:  New/Acute Problems: Left Knee Injury  Pain is improving.  He will follow-up with orthopedics as needed though he will let us  know if he needs any further assistance.  Chronic Problems Addressed Today: Hypertension Blood pressure not controlled today at 170/118.  This has been a longstanding issue and we stressed the importance of adequate hypertension control to prevent long-term complications of this has been a longstanding issue for him.    We started spironolactone  a few months ago but he could not tolerate due to urinary frequency.  He has had bad reactions to amlodipine  in the past as well.  Will continue his losartan /HCTZ 100-25 once daily.  We discussed other treatment options.  Will start metoprolol  succinate 50 mg daily and he will follow-up with us  in a week or 2 via MyChart.  Depending on response to this may need referral to advanced hypertension clinic.  No signs of endorgan damage today.  Check labs today.  Prediabetes Check A1c with labs.  Discussed lifestyle modifications.  Preventative Healthcare: Check labs.  Vaccines declined.  Patient Counseling(The following topics were reviewed and/or handout was given):  -Nutrition: Stressed importance of moderation in sodium/caffeine intake, saturated fat and cholesterol, caloric balance, sufficient intake of fresh fruits, vegetables, and fiber.  -Stressed the importance of regular exercise.   -Substance Abuse: Discussed cessation/primary prevention of tobacco, alcohol, or other drug use; driving or other dangerous activities under the influence; availability of treatment for abuse.   -Injury prevention: Discussed safety belts, safety helmets, smoke detector, smoking near bedding or upholstery.   -Sexuality: Discussed sexually transmitted diseases, partner selection, use of condoms, avoidance  of unintended pregnancy and contraceptive alternatives.   -Dental health: Discussed importance of regular tooth brushing, flossing, and dental visits.  -Health maintenance and immunizations reviewed. Please refer to Health maintenance section.  Return to care in 1 year for next preventative visit.     Subjective:  HPI:  He has no acute complaints today. Patient is here today for his annual physical.  See assessment / plan for status of chronic conditions.  Discussed the use of AI scribe software for clinical note transcription with the patient, who gave verbal consent to proceed.  History of Present Illness Timothy Sellers is a 44 year old male with hypertension who presents for blood pressure management.  He has a history of hypertension with home blood pressure readings ranging from 140 mmHg to 160 mmHg. Today, his blood pressure was recorded at 190 mmHg. He previously discontinued spironolactone  due to symptoms resembling a bladder infection, including frequent urination, and stopped amlodipine  due to bleeding issues. He is currently on the maximum dose of Losartan /hctz 100-25 mg.  He was engaging in physical activity, running five days a week, until he sustained a knee injury on Thanksgiving Day while running with his daughter's stroller. The injury occurred when he stopped abruptly to avoid a collision. Since then, he has not been able to exercise and is managing the injury with rest, ice, and compression.  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 No other symptoms or concerns beyond those related to his blood pressure and knee injury.      02/19/2024   10:00 AM  Depression screen PHQ  2/9  Decreased Interest 0  Down, Depressed, Hopeless 0  PHQ - 2 Score 0    There are no preventive care reminders to display for this  patient.   ROS: Per HPI, otherwise a complete review of systems was negative.   PMH:  The following were reviewed and entered/updated in epic: Past Medical History:  Diagnosis Date   Hypertension    Patient Active Problem List   Diagnosis Date Noted   Sleep-disordered breathing 07/30/2022   Prediabetes 08/17/2019   Hypertension 09/19/2016   Right knee pain 03/21/2015   Past Surgical History:  Procedure Laterality Date   MOUTH SURGERY     None      Family History  Problem Relation Age of Onset   Hypertension Mother    Kidney failure Father    Diabetes Father    Asthma Sister    Kidney failure Maternal Grandmother    Stroke Maternal Grandfather     Medications- reviewed and updated Current Outpatient Medications  Medication Sig Dispense Refill   meloxicam  (MOBIC ) 15 MG tablet Take 1 tablet (15 mg total) by mouth daily. 30 tablet 0   metoprolol  succinate (TOPROL -XL) 50 MG 24 hr tablet Take 1 tablet (50 mg total) by mouth daily. Take with or immediately following a meal. 90 tablet 3   losartan -hydrochlorothiazide  (HYZAAR ) 100-25 MG tablet Take 1 tablet by mouth daily. 90 tablet 3   No current facility-administered medications for this visit.    Allergies-reviewed and updated Allergies[1]  Social History   Socioeconomic History   Marital status: Married    Spouse name: Not on file   Number of children: 2   Years of education: Not on file   Highest education level: Not on file  Occupational History    Comment: Owner of a cleaning company  Tobacco Use   Smoking status: Never   Smokeless tobacco: Never  Vaping Use   Vaping status: Never Used  Substance and Sexual Activity   Alcohol use: Yes    Alcohol/week: 0.0 standard drinks of alcohol    Comment: socially   Drug use: No   Sexual activity: Yes  Other Topics Concern   Not on file  Social History Narrative   Not on file   Social Drivers of Health   Tobacco Use: Low Risk (01/02/2024)   Patient  History    Smoking Tobacco Use: Never    Smokeless Tobacco Use: Never    Passive Exposure: Not on file  Financial Resource Strain: Not on file  Food Insecurity: Not on file  Transportation Needs: Not on file  Physical Activity: Not on file  Stress: Not on file  Social Connections: Not on file  Depression (PHQ2-9): Low Risk (02/19/2024)   Depression (PHQ2-9)    PHQ-2 Score: 0  Alcohol Screen: Not on file  Housing: Not on file  Utilities: Not on file  Health Literacy: Not on file        Objective:  Physical Exam: BP (!) 170/118   Pulse 91   Temp 98.4 F (36.9 C) (Temporal)   Resp 14   Ht 6' (1.829 m)   Wt 265 lb 3.2 oz (120.3 kg)   SpO2 99%   BMI 35.97 kg/m   Body mass index is 35.97 kg/m. Wt Readings from Last 3 Encounters:  02/19/24 265 lb 3.2 oz (120.3 kg)  01/02/24 250 lb (113.4 kg)  09/09/23 264 lb (119.7 kg)   Gen: NAD, resting comfortably HEENT: TMs normal bilaterally. OP clear. No thyromegaly  noted.  CV: RRR with no murmurs appreciated Pulm: NWOB, CTAB with no crackles, wheezes, or rhonchi GI: Normal bowel sounds present. Soft, Nontender, Nondistended. MSK: no edema, cyanosis, or clubbing noted Skin: warm, dry Neuro: CN2-12 grossly intact. Strength 5/5 in upper and lower extremities. Reflexes symmetric and intact bilaterally.  Psych: Normal affect and thought content     Dace Denn M. Kennyth, MD 02/19/2024 10:39 AM     [1]  Allergies Allergen Reactions   Justicia Adhatoda (Malabar Nut Tree) [Justicia Adhatoda] Anaphylaxis   Hydrocodone-Acetaminophen Hives   Shellfish Allergy Hives   "

## 2024-02-19 NOTE — Patient Instructions (Addendum)
 It was very nice to see you today!  VISIT SUMMARY: Today, we discussed your blood pressure management and foot injury. Your blood pressure remains high, and we have adjusted your medication. We also reviewed the management of your foot injury.  YOUR PLAN: HYPERTENSION: Your blood pressure is still high, with readings up to 190 mmHg. Previous medications were stopped due to side effects. -Start taking metoprolol  once daily. -Monitor your blood pressure at home and report the readings in 1-2 weeks. -We will do some blood work to check your overall health.  Knee INJURY: You injured your foot while running and have been managing it with rest and ice. -Continue to rest and use ice compression on your Knee. -If needed, we can refer you to an orthopedic specialist.  GENERAL HEALTH MAINTENANCE: We need to do some blood work and discuss your vaccination status. -Order blood work. -Discuss vaccination status and defer for now.  Return in about 1 year (around 02/18/2025) for Annual Physical.   Take care, Dr Kennyth  PLEASE NOTE:  If you had any lab tests, please let us  know if you have not heard back within a few days. You may see your results on mychart before we have a chance to review them but we will give you a call once they are reviewed by us .   If we ordered any referrals today, please let us  know if you have not heard from their office within the next week.   If you had any urgent prescriptions sent in today, please check with the pharmacy within an hour of our visit to make sure the prescription was transmitted appropriately.   Please try these tips to maintain a healthy lifestyle:  Eat at least 3 REAL meals and 1-2 snacks per day.  Aim for no more than 5 hours between eating.  If you eat breakfast, please do so within one hour of getting up.   Each meal should contain half fruits/vegetables, one quarter protein, and one quarter carbs (no bigger than a computer mouse)  Cut down on  sweet beverages. This includes juice, soda, and sweet tea.   Drink at least 1 glass of water with each meal and aim for at least 8 glasses per day  Exercise at least 150 minutes every week.     Preventive Care 28-30 Years Old, Male Preventive care refers to lifestyle choices and visits with your health care provider that can promote health and wellness. Preventive care visits are also called wellness exams. What can I expect for my preventive care visit? Counseling During your preventive care visit, your health care provider may ask about your: Medical history, including: Past medical problems. Family medical history. Current health, including: Emotional well-being. Home life and relationship well-being. Sexual activity. Lifestyle, including: Alcohol, nicotine or tobacco, and drug use. Access to firearms. Diet, exercise, and sleep habits. Safety issues such as seatbelt and bike helmet use. Sunscreen use. Work and work astronomer. Physical exam Your health care provider will check your: Height and weight. These may be used to calculate your BMI (body mass index). BMI is a measurement that tells if you are at a healthy weight. Waist circumference. This measures the distance around your waistline. This measurement also tells if you are at a healthy weight and may help predict your risk of certain diseases, such as type 2 diabetes and high blood pressure. Heart rate and blood pressure. Body temperature. Skin for abnormal spots. What immunizations do I need?  Vaccines are usually given  at various ages, according to a schedule. Your health care provider will recommend vaccines for you based on your age, medical history, and lifestyle or other factors, such as travel or where you work. What tests do I need? Screening Your health care provider may recommend screening tests for certain conditions. This may include: Lipid and cholesterol levels. Diabetes screening. This is done by  checking your blood sugar (glucose) after you have not eaten for a while (fasting). Hepatitis B test. Hepatitis C test. HIV (human immunodeficiency virus) test. STI (sexually transmitted infection) testing, if you are at risk. Lung cancer screening. Prostate cancer screening. Colorectal cancer screening. Talk with your health care provider about your test results, treatment options, and if necessary, the need for more tests. Follow these instructions at home: Eating and drinking  Eat a diet that includes fresh fruits and vegetables, whole grains, lean protein, and low-fat dairy products. Take vitamin and mineral supplements as recommended by your health care provider. Do not drink alcohol if your health care provider tells you not to drink. If you drink alcohol: Limit how much you have to 0-2 drinks a day. Know how much alcohol is in your drink. In the [place] one drink equals one 12 oz bottle of beer (355 mL), one 5 oz glass of wine (148 mL), or one 1 oz glass of hard liquor (44 mL). Lifestyle Brush your teeth every morning and night with fluoride toothpaste. Floss one time each day. Exercise for at least 30 minutes 5 or more days each week. Do not use any products that contain nicotine or tobacco. These products include cigarettes, chewing tobacco, and vaping devices, such as e-cigarettes. If you need help quitting, ask your health care provider. Do not use drugs. If you are sexually active, practice safe sex. Use a condom or other form of protection to prevent STIs. Take aspirin only as told by your health care provider. Make sure that you understand how much to take and what form to take. Work with your health care provider to find out whether it is safe and beneficial for you to take aspirin daily. Find healthy ways to manage stress, such as: Meditation, yoga, or listening to music. Journaling. Talking to a trusted person. Spending time with friends and family. Minimize exposure to  UV radiation to reduce your risk of skin cancer. Safety Always wear your seat belt while driving or riding in a vehicle. Do not drive: If you have been drinking alcohol. Do not ride with someone who has been drinking. When you are tired or distracted. While texting. If you have been using any mind-altering substances or drugs. Wear a helmet and other protective equipment during sports activities. If you have firearms in your house, make sure you follow all gun safety procedures. What's next? Go to your health care provider once a year for an annual wellness visit. Ask your health care provider how often you should have your eyes and teeth checked. Stay up to date on all vaccines. This information is not intended to replace advice given to you by your health care provider. Make sure you discuss any questions you have with your health care provider. Document Revised: 07/19/2020 Document Reviewed: 07/19/2020 Elsevier Patient Education  2024 [employer].

## 2024-02-19 NOTE — Assessment & Plan Note (Signed)
 Check A1c with labs.  Discussed lifestyle modifications.

## 2024-02-19 NOTE — Assessment & Plan Note (Addendum)
 Blood pressure not controlled today at 170/118.  This has been a longstanding issue and we stressed the importance of adequate hypertension control to prevent long-term complications of this has been a longstanding issue for him.    We started spironolactone  a few months ago but he could not tolerate due to urinary frequency.  He has had bad reactions to amlodipine  in the past as well.  Will continue his losartan /HCTZ 100-25 once daily.  We discussed other treatment options.  Will start metoprolol  succinate 50 mg daily and he will follow-up with us  in a week or 2 via MyChart.  Depending on response to this may need referral to advanced hypertension clinic.  No signs of endorgan damage today.  Check labs today.

## 2024-02-20 ENCOUNTER — Ambulatory Visit: Payer: Self-pay | Admitting: Family Medicine

## 2024-02-20 NOTE — Progress Notes (Signed)
 His A1c is very mildly elevated but similar to the last few years we checked.  All of his other labs are at goal.  Do not need to start any medication or make any changes to his treatment plan.  He should keep up the great work with diet and exercise and we can recheck everything in a year or so.  I would like for him to check in with us  in a couple weeks to let us  know how he is doing with the new blood pressure medication.

## 2024-03-05 ENCOUNTER — Other Ambulatory Visit: Payer: Self-pay

## 2024-03-05 ENCOUNTER — Other Ambulatory Visit (HOSPITAL_BASED_OUTPATIENT_CLINIC_OR_DEPARTMENT_OTHER): Payer: Self-pay

## 2024-03-05 ENCOUNTER — Other Ambulatory Visit (HOSPITAL_COMMUNITY): Payer: Self-pay

## 2024-03-05 NOTE — Telephone Encounter (Signed)
 See patient BP report

## 2024-03-08 ENCOUNTER — Other Ambulatory Visit (HOSPITAL_BASED_OUTPATIENT_CLINIC_OR_DEPARTMENT_OTHER): Payer: Self-pay

## 2024-03-08 ENCOUNTER — Other Ambulatory Visit (HOSPITAL_COMMUNITY): Payer: Self-pay

## 2024-03-08 ENCOUNTER — Other Ambulatory Visit: Payer: Self-pay

## 2024-03-08 NOTE — Telephone Encounter (Signed)
 I appreciate the update. His numbers are looking better but not quite to goal. Recommend we increase his metoprolol  to 100 mg daily and have him follow up again in a couple of weeks.

## 2025-02-22 ENCOUNTER — Encounter: Admitting: Family Medicine
